# Patient Record
Sex: Male | Born: 1990 | Race: Black or African American | Hispanic: No | Marital: Single | Smoking: Never smoker
Health system: Southern US, Community
[De-identification: ages and names within clinical notes are randomized; demographics above are authoritative.]

---

## 2010-06-01 ENCOUNTER — Emergency Department (HOSPITAL_COMMUNITY)
Admission: EM | Admit: 2010-06-01 | Discharge: 2010-06-01 | Payer: Self-pay | Source: Home / Self Care | Admitting: Emergency Medicine

## 2010-06-06 ENCOUNTER — Emergency Department (HOSPITAL_COMMUNITY)
Admission: EM | Admit: 2010-06-06 | Discharge: 2010-06-09 | Payer: Self-pay | Source: Home / Self Care | Admitting: Family Medicine

## 2010-06-09 ENCOUNTER — Emergency Department (HOSPITAL_COMMUNITY)
Admission: EM | Admit: 2010-06-09 | Discharge: 2010-06-09 | Payer: Self-pay | Source: Home / Self Care | Admitting: Emergency Medicine

## 2010-10-24 ENCOUNTER — Emergency Department (HOSPITAL_COMMUNITY)
Admission: EM | Admit: 2010-10-24 | Discharge: 2010-10-25 | Disposition: A | Discharge status: Home / Self Care | Payer: Self-pay | Attending: Emergency Medicine | Admitting: Emergency Medicine

## 2010-10-24 DIAGNOSIS — J3489 Other specified disorders of nose and nasal sinuses: Secondary | ICD-10-CM | POA: Insufficient documentation

## 2010-10-24 DIAGNOSIS — R05 Cough: Secondary | ICD-10-CM | POA: Insufficient documentation

## 2010-10-24 DIAGNOSIS — R509 Fever, unspecified: Secondary | ICD-10-CM | POA: Insufficient documentation

## 2010-10-24 DIAGNOSIS — J111 Influenza due to unidentified influenza virus with other respiratory manifestations: Secondary | ICD-10-CM | POA: Insufficient documentation

## 2010-10-24 DIAGNOSIS — R059 Cough, unspecified: Secondary | ICD-10-CM | POA: Insufficient documentation

## 2010-10-24 DIAGNOSIS — R61 Generalized hyperhidrosis: Secondary | ICD-10-CM | POA: Insufficient documentation

## 2010-10-25 ENCOUNTER — Emergency Department (HOSPITAL_COMMUNITY): Payer: Self-pay

## 2010-11-08 ENCOUNTER — Emergency Department (HOSPITAL_COMMUNITY)
Admission: EM | Admit: 2010-11-08 | Discharge: 2010-11-08 | Disposition: A | Discharge status: Home / Self Care | Payer: Self-pay | Attending: Emergency Medicine | Admitting: Emergency Medicine

## 2010-11-08 DIAGNOSIS — R599 Enlarged lymph nodes, unspecified: Secondary | ICD-10-CM | POA: Insufficient documentation

## 2010-11-08 DIAGNOSIS — J029 Acute pharyngitis, unspecified: Secondary | ICD-10-CM | POA: Insufficient documentation

## 2010-11-08 DIAGNOSIS — R1310 Dysphagia, unspecified: Secondary | ICD-10-CM | POA: Insufficient documentation

## 2010-11-08 LAB — RAPID STREP SCREEN (MED CTR MEBANE ONLY): Streptococcus, Group A Screen (Direct): NEGATIVE

## 2012-09-11 ENCOUNTER — Emergency Department (INDEPENDENT_AMBULATORY_CARE_PROVIDER_SITE_OTHER): Payer: Self-pay

## 2012-09-11 ENCOUNTER — Encounter (HOSPITAL_COMMUNITY): Payer: Self-pay | Admitting: Emergency Medicine

## 2012-09-11 ENCOUNTER — Emergency Department (INDEPENDENT_AMBULATORY_CARE_PROVIDER_SITE_OTHER)
Admission: EM | Admit: 2012-09-11 | Discharge: 2012-09-11 | Disposition: A | Discharge status: Home / Self Care | Payer: Self-pay | Source: Home / Self Care

## 2012-09-11 DIAGNOSIS — S62609A Fracture of unspecified phalanx of unspecified finger, initial encounter for closed fracture: Secondary | ICD-10-CM

## 2012-09-11 NOTE — ED Notes (Signed)
Splint applied to left pinky by EMT.

## 2012-09-11 NOTE — ED Notes (Signed)
Pt c/o left pinky injury while playing basketball yesterday. Is red and swollen. Is unable to bend. Pt used ice pack at onset of symptoms with mild relief. Patient is alert and oriented.

## 2012-10-18 NOTE — ED Provider Notes (Signed)
History     CSN: [id number]  Arrival date & time 09/11/12  1326   First MD Initiated Contact with Patient 09/11/12 1412      Chief Complaint  Patient presents with  . Hand Injury    (Consider location/radiation/quality/duration/timing/severity/associated sxs/prior treatment) HPI  History reviewed. No pertinent past medical history.  History reviewed. No pertinent past surgical history.  History reviewed. No pertinent family history.  History  Substance Use Topics  . Smoking status: Never Smoker   . Smokeless tobacco: Never Used  . Alcohol Use: 1.2 oz/week    1 Glasses of wine, 1 Shots of liquor per week     Comment: occasionally      Review of Systems  Allergies  Review of patient's allergies indicates no known allergies.  Home Medications  No current outpatient prescriptions on file.  BP 147/89  Pulse 66  Temp(Src) 98.2 F (36.8 C) (Oral)  SpO2 95%  Physical Exam  ED Course  Procedures (including critical care time)  Labs Reviewed - No data to display No results found.   1. Closed fracture of phalanx of finger of left hand, initial encounter       MDM  Cont to use current splint and Ibuprofen and Tylenol. F/u with Dr Mina Marble.         Johnathan Cantor, MD 10/18/12 2044

## 2019-06-13 ENCOUNTER — Encounter: Payer: Self-pay | Admitting: Nurse Practitioner

## 2019-06-13 ENCOUNTER — Ambulatory Visit (INDEPENDENT_AMBULATORY_CARE_PROVIDER_SITE_OTHER): Payer: BC Managed Care – PPO | Admitting: Nurse Practitioner

## 2019-06-13 ENCOUNTER — Other Ambulatory Visit: Payer: Self-pay

## 2019-06-13 VITALS — BP 134/80 | HR 62 | Temp 98.3°F | Ht 69.4 in | Wt 205.8 lb

## 2019-06-13 DIAGNOSIS — R109 Unspecified abdominal pain: Secondary | ICD-10-CM

## 2019-06-13 LAB — POCT URINALYSIS DIPSTICK
Bilirubin, UA: NEGATIVE
Blood, UA: NEGATIVE
Glucose, UA: NEGATIVE
Ketones, UA: NEGATIVE
Leukocytes, UA: NEGATIVE
Nitrite, UA: NEGATIVE
Protein, UA: NEGATIVE
Spec Grav, UA: 1.025 (ref 1.010–1.025)
Urobilinogen, UA: 0.2 E.U./dL
pH, UA: 7 (ref 5.0–8.0)

## 2019-06-13 NOTE — Progress Notes (Signed)
This visit occurred during the SARS-CoV-2 public health emergency.  Safety protocols were in place, including screening questions prior to the visit, additional usage of staff PPE, and extensive cleaning of exam room while observing appropriate contact time as indicated for disinfecting solutions.  Subjective:     Patient ID: Johnathan English , male    DOB: 01-Aug-1990 , 29 y.o.   MRN: [id number]   Chief Complaint  Patient presents with  . Establish Care  . Hernia    patient stated he has been having pain around his waist line for the past couple of months he think he has a hernia     HPI  Here to establish care - he was referred by his mom Carolan Clines.  He has not had a primary care provider in several years. The last provider he had seen was at [facility] in [place].  Works at [employer] in [place].  He is engaged - things are on hold currently due the Pandemic.  One child and one on the way.    He was seen for the abdominal pain and discomfort.    PMH - healthy.  No surgeries.    [facility] - Mother - healthy, Siblings - healthy.   Alcohol - 1-2 oz liquor day and weekends 1/2 pint Abdominal Pain This is a new problem. The current episode started more than 1 month ago (June or July). The onset quality is gradual. The problem occurs intermittently. Progression since onset: worsened in November 2020  Pain location: low abdominal pain around waist area.  Also radiated around to back. The quality of the pain is aching. Pain radiation: waist and lower back. Associated symptoms include constipation (intermittent). Pertinent negatives include no diarrhea, headaches, nausea or vomiting. Exacerbated by: sitting for long time and changing positions. Treatments tried: ibuprofen. There is no history of abdominal surgery or GERD.     History reviewed. No pertinent past medical history.   Family History  Problem Relation Age of Onset  . Healthy Sister   . Healthy  Brother     No current outpatient medications on file.   No Known Allergies   Review of Systems  Constitutional: Negative.   Respiratory: Negative.   Cardiovascular: Negative.   Gastrointestinal: Positive for abdominal pain and constipation (intermittent). Negative for diarrhea, nausea and vomiting.  Endocrine: Negative for polydipsia, polyphagia and polyuria.  Neurological: Negative for headaches.  Psychiatric/Behavioral: Negative.       Today's Vitals   06/13/19 1148  BP: 134/80  Pulse: 62  Temp: 98.3 F (36.8 C)  TempSrc: Oral  Weight: 205 lb 12.8 oz (93.4 kg)  Height: 5' 9.4" (1.763 m)  PainSc: 0-No pain   Body mass index is 30.04 kg/m.   Objective:  Physical Exam Vitals reviewed.  Constitutional:      Appearance: Normal appearance.  Cardiovascular:     Rate and Rhythm: Normal rate and regular rhythm.     Pulses: Normal pulses.     Heart sounds: Normal heart sounds. No murmur.  Pulmonary:     Effort: Pulmonary effort is normal. No respiratory distress.     Breath sounds: Normal breath sounds.  Abdominal:     General: Bowel sounds are normal. There is no distension.     Palpations: Abdomen is soft. There is no mass.     Tenderness: There is abdominal tenderness (mild general tenderness).  Neurological:     General: No focal deficit present.  Mental Status: He is alert and oriented to person, place, and time.  Psychiatric:        Mood and Affect: Mood normal.        Behavior: Behavior normal.        Thought Content: Thought content normal.        Judgment: Judgment normal.         Assessment And Plan:     1. Abdominal discomfort  Ongoing abdominal pain, mild tenderness general  Encouraged to increase water and fiber intake  Will check labs below and check an abdominal xray - CBC with Differential/Platelet - CMP14+EGFR - Hemoglobin A1c - Lipase - Amylase - US Abdomen Complete; Future   Minette Brine, FNP    THE PATIENT IS ENCOURAGED TO  PRACTICE SOCIAL DISTANCING DUE TO THE COVID-19 PANDEMIC.

## 2019-06-13 NOTE — Patient Instructions (Signed)
Take an over the counter probiotic daily to help gut health

## 2019-06-14 LAB — CBC WITH DIFFERENTIAL/PLATELET
Basophils Absolute: 0 10*3/uL (ref 0.0–0.2)
Basos: 0 %
EOS (ABSOLUTE): 0.2 10*3/uL (ref 0.0–0.4)
Eos: 4 %
Hematocrit: 46.2 % (ref 37.5–51.0)
Hemoglobin: 15.7 g/dL (ref 13.0–17.7)
Immature Grans (Abs): 0 10*3/uL (ref 0.0–0.1)
Immature Granulocytes: 0 %
Lymphocytes Absolute: 1.8 10*3/uL (ref 0.7–3.1)
Lymphs: 38 %
MCH: 29.3 pg (ref 26.6–33.0)
MCHC: 34 g/dL (ref 31.5–35.7)
MCV: 86 fL (ref 79–97)
Monocytes Absolute: 0.4 10*3/uL (ref 0.1–0.9)
Monocytes: 9 %
Neutrophils Absolute: 2.4 10*3/uL (ref 1.4–7.0)
Neutrophils: 49 %
Platelets: 194 10*3/uL (ref 150–450)
RBC: 5.36 x10E6/uL (ref 4.14–5.80)
RDW: 12.4 % (ref 11.6–15.4)
WBC: 4.9 10*3/uL (ref 3.4–10.8)

## 2019-06-14 LAB — CMP14+EGFR
ALT: 17 IU/L (ref 0–44)
AST: 23 IU/L (ref 0–40)
Albumin/Globulin Ratio: 1.6 (ref 1.2–2.2)
Albumin: 4.7 g/dL (ref 4.1–5.2)
Alkaline Phosphatase: 82 IU/L (ref 39–117)
BUN/Creatinine Ratio: 11 (ref 9–20)
BUN: 11 mg/dL (ref 6–20)
Bilirubin Total: 0.3 mg/dL (ref 0.0–1.2)
CO2: 24 mmol/L (ref 20–29)
Calcium: 9.5 mg/dL (ref 8.7–10.2)
Chloride: 103 mmol/L (ref 96–106)
Creatinine, Ser: 0.98 mg/dL (ref 0.76–1.27)
GFR calc Af Amer: 119 mL/min/{1.73_m2} (ref >59)
GFR calc non Af Amer: 103 mL/min/{1.73_m2} (ref >59)
Globulin, Total: 3 g/dL (ref 1.5–4.5)
Glucose: 103 mg/dL — ABNORMAL HIGH (ref 65–99)
Potassium: 3.9 mmol/L (ref 3.5–5.2)
Sodium: 140 mmol/L (ref 134–144)
Total Protein: 7.7 g/dL (ref 6.0–8.5)

## 2019-06-14 LAB — AMYLASE: Amylase: 64 U/L (ref 31–110)

## 2019-06-14 LAB — HEMOGLOBIN A1C
Est. average glucose Bld gHb Est-mCnc: 120 mg/dL
Hgb A1c MFr Bld: 5.8 % — ABNORMAL HIGH (ref 4.8–5.6)

## 2019-06-14 LAB — LIPASE: Lipase: 14 U/L (ref 13–78)

## 2019-06-29 ENCOUNTER — Other Ambulatory Visit: Payer: Self-pay

## 2019-07-06 ENCOUNTER — Other Ambulatory Visit: Payer: Self-pay

## 2019-07-13 ENCOUNTER — Ambulatory Visit
Admission: RE | Admit: 2019-07-13 | Discharge: 2019-07-13 | Disposition: A | Discharge status: Home / Self Care | Payer: BC Managed Care – PPO | Source: Ambulatory Visit | Attending: Nurse Practitioner | Admitting: Nurse Practitioner

## 2019-07-13 DIAGNOSIS — R109 Unspecified abdominal pain: Secondary | ICD-10-CM

## 2019-08-08 ENCOUNTER — Ambulatory Visit: Payer: BC Managed Care – PPO | Admitting: Nurse Practitioner

## 2020-01-10 ENCOUNTER — Encounter: Payer: Self-pay | Admitting: Nurse Practitioner

## 2020-01-10 ENCOUNTER — Ambulatory Visit (INDEPENDENT_AMBULATORY_CARE_PROVIDER_SITE_OTHER): Payer: BC Managed Care – PPO | Admitting: Nurse Practitioner

## 2020-01-10 ENCOUNTER — Other Ambulatory Visit: Payer: Self-pay

## 2020-01-10 VITALS — BP 160/88 | HR 87 | Temp 98.0°F | Ht 68.8 in | Wt 270.0 lb

## 2020-01-10 DIAGNOSIS — Z1159 Encounter for screening for other viral diseases: Secondary | ICD-10-CM | POA: Diagnosis not present

## 2020-01-10 DIAGNOSIS — M545 Low back pain, unspecified: Secondary | ICD-10-CM

## 2020-01-10 DIAGNOSIS — Z2821 Immunization not carried out because of patient refusal: Secondary | ICD-10-CM

## 2020-01-10 DIAGNOSIS — G8929 Other chronic pain: Secondary | ICD-10-CM | POA: Diagnosis not present

## 2020-01-10 DIAGNOSIS — R7309 Other abnormal glucose: Secondary | ICD-10-CM | POA: Diagnosis not present

## 2020-01-10 MED ORDER — KETOROLAC TROMETHAMINE 60 MG/2ML IM SOLN
60.0000 mg | Freq: Once | INTRAMUSCULAR | Status: AC
  Administered 2020-01-10: 60 mg via INTRAMUSCULAR

## 2020-01-10 NOTE — Progress Notes (Signed)
This visit occurred during the SARS-CoV-2 public health emergency.  Safety protocols were in place, including screening questions prior to the visit, additional usage of staff PPE, and extensive cleaning of exam room while observing appropriate contact time as indicated for disinfecting solutions.  Subjective:     Patient ID: Johnathan English , male    DOB: 08/22/1990 , 29 y.o.   MRN: [id number]   Chief Complaint  Patient presents with  . Back Pain    Patient stated he has some back pain     HPI  Wt Readings from Last 3 Encounters: 01/10/20 : 270 lb (122.5 kg) 06/13/19 : 205 lb 12.8 oz (93.4 kg)  He reports he is unable to bend his back.  He has been at his job for 5 years at [employer]. He does stretch but not as much as he should.   He eats baked chicken, steak.  He is not drinking enough water.   Back Pain This is a chronic problem. The current episode started more than 1 month ago. The problem occurs intermittently. The pain is present in the lumbar spine. The quality of the pain is described as aching. The pain does not radiate. The symptoms are aggravated by bending. Pertinent negatives include no abdominal pain, headaches or paresthesias. Risk factors include obesity, lack of exercise and sedentary lifestyle. He has tried nothing for the symptoms.     History reviewed. No pertinent past medical history.   Family History  Problem Relation Age of Onset  . Healthy Sister   . Healthy Brother     No current outpatient medications on file.   No Known Allergies   Review of Systems  Constitutional: Negative.  Negative for fatigue.  Respiratory: Negative.  Negative for cough.   Cardiovascular: Negative.   Gastrointestinal: Negative.  Negative for abdominal pain.  Musculoskeletal: Positive for back pain.  Skin: Negative.   Neurological: Negative for dizziness, headaches and paresthesias.  Psychiatric/Behavioral: Negative.      Today's Vitals    01/10/20 1518  BP: (!) 160/88  Pulse: 87  Temp: 98 F (36.7 C)  TempSrc: Oral  Weight: 270 lb (122.5 kg)  Height: 5' 8.8" (1.748 m)  PainSc: 6   PainLoc: Back   Body mass index is 40.1 kg/m.   Objective:  Physical Exam Constitutional:      General: He is not in acute distress.    Appearance: Normal appearance. He is obese.  Cardiovascular:     Rate and Rhythm: Normal rate and regular rhythm.     Pulses: Normal pulses.     Heart sounds: Normal heart sounds.  Pulmonary:     Effort: Pulmonary effort is normal. No respiratory distress.     Breath sounds: Normal breath sounds. No wheezing.  Skin:    Capillary Refill: Capillary refill takes less than 2 seconds.  Neurological:     General: No focal deficit present.     Mental Status: He is alert and oriented to person, place, and time.     Cranial Nerves: No cranial nerve deficit.  Psychiatric:        Mood and Affect: Mood normal.        Behavior: Behavior normal.        Thought Content: Thought content normal.        Judgment: Judgment normal.         Assessment And Plan:     1. Chronic bilateral low back pain without sciatica  May be   arthritis vs muscle spasms  Toradol 60 mg IM given in office  Will also check xray to evaluate for structural damage - DG Lumbar Spine Complete; Future - ketorolac (TORADOL) injection 60 mg  2. Abnormal glucose  Chronic, stable  Continue with current medications  Encouraged to limit intake of sugary foods and drinks  Encouraged to increase physical activity to 150 minutes per week as tolerated - Hemoglobin A1c - BMP8+eGFR  3. Encounter for hepatitis C screening test for low risk patient  Will check Hepatitis C screening due to recent recommendations to screen all adults 18 years and older - Hepatitis C antibody  4. COVID-19 virus vaccination declined Declines covid 19 vaccine. Discussed risk of covid 19 and if he changes her mind about the vaccine to call the office.   Encouraged to take multivitamin, vitamin d, vitamin c and zinc to increase immune system. Aware can call office if would like to have vaccine here at office.     Patient was given opportunity to ask questions. Patient verbalized understanding of the plan and was able to repeat key elements of the plan. All questions were answered to their satisfaction.  Janece Moore, FNP   I, Janece Moore, FNP, have reviewed all documentation for this visit. The documentation on 02/07/20 for the exam, diagnosis, procedures, and orders are all accurate and complete.   THE PATIENT IS ENCOURAGED TO PRACTICE SOCIAL DISTANCING DUE TO THE COVID-19 PANDEMIC.   

## 2020-01-10 NOTE — Patient Instructions (Signed)
COVID-19 Vaccine Information can be found at: [email or web address] For questions related to vaccine distribution or appointments, please email [email or web address] or call [phone].    

## 2020-01-11 LAB — BMP8+EGFR
BUN/Creatinine Ratio: 13 (ref 9–20)
BUN: 12 mg/dL (ref 6–20)
CO2: 24 mmol/L (ref 20–29)
Calcium: 9.6 mg/dL (ref 8.7–10.2)
Chloride: 105 mmol/L (ref 96–106)
Creatinine, Ser: 0.95 mg/dL (ref 0.76–1.27)
GFR calc Af Amer: 123 mL/min/{1.73_m2} (ref >59)
GFR calc non Af Amer: 106 mL/min/{1.73_m2} (ref >59)
Glucose: 89 mg/dL (ref 65–99)
Potassium: 4.2 mmol/L (ref 3.5–5.2)
Sodium: 142 mmol/L (ref 134–144)

## 2020-01-11 LAB — HEPATITIS C ANTIBODY: Hep C Virus Ab: <0.1 s/co ratio (ref 0.0–0.9)

## 2020-01-11 LAB — HEMOGLOBIN A1C
Est. average glucose Bld gHb Est-mCnc: 123 mg/dL
Hgb A1c MFr Bld: 5.9 % — ABNORMAL HIGH (ref 4.8–5.6)

## 2020-01-29 ENCOUNTER — Other Ambulatory Visit: Payer: BC Managed Care – PPO

## 2020-01-29 ENCOUNTER — Other Ambulatory Visit: Payer: Self-pay

## 2020-01-29 ENCOUNTER — Ambulatory Visit
Admission: RE | Admit: 2020-01-29 | Discharge: 2020-01-29 | Disposition: A | Discharge status: Home / Self Care | Payer: BC Managed Care – PPO | Source: Ambulatory Visit | Attending: Nurse Practitioner | Admitting: Nurse Practitioner

## 2020-01-29 DIAGNOSIS — Z20822 Contact with and (suspected) exposure to covid-19: Secondary | ICD-10-CM

## 2020-01-29 DIAGNOSIS — M545 Low back pain, unspecified: Secondary | ICD-10-CM

## 2020-01-30 LAB — SPECIMEN STATUS REPORT

## 2020-01-30 LAB — SARS-COV-2, NAA 2 DAY TAT

## 2020-01-30 LAB — NOVEL CORONAVIRUS, NAA: SARS-CoV-2, NAA: NOT DETECTED

## 2020-07-10 ENCOUNTER — Encounter: Payer: Self-pay | Admitting: Nurse Practitioner

## 2020-07-10 ENCOUNTER — Other Ambulatory Visit: Payer: Self-pay

## 2020-07-10 ENCOUNTER — Ambulatory Visit (INDEPENDENT_AMBULATORY_CARE_PROVIDER_SITE_OTHER): Payer: BC Managed Care – PPO | Admitting: Nurse Practitioner

## 2020-07-10 VITALS — BP 122/86 | HR 89 | Temp 97.7°F | Ht 68.8 in | Wt 290.0 lb

## 2020-07-10 DIAGNOSIS — H6121 Impacted cerumen, right ear: Secondary | ICD-10-CM | POA: Diagnosis not present

## 2020-07-10 DIAGNOSIS — R7309 Other abnormal glucose: Secondary | ICD-10-CM | POA: Diagnosis not present

## 2020-07-10 DIAGNOSIS — Z2821 Immunization not carried out because of patient refusal: Secondary | ICD-10-CM

## 2020-07-10 DIAGNOSIS — Z0001 Encounter for general adult medical examination with abnormal findings: Secondary | ICD-10-CM

## 2020-07-10 DIAGNOSIS — Z6841 Body Mass Index (BMI) 40.0 and over, adult: Secondary | ICD-10-CM

## 2020-07-10 DIAGNOSIS — M545 Low back pain, unspecified: Secondary | ICD-10-CM

## 2020-07-10 DIAGNOSIS — G8929 Other chronic pain: Secondary | ICD-10-CM | POA: Diagnosis not present

## 2020-07-10 DIAGNOSIS — Z0000 Encounter for general adult medical examination without abnormal findings: Secondary | ICD-10-CM

## 2020-07-10 NOTE — Patient Instructions (Addendum)
Physical Activity With Heart Disease Being active has many benefits, especially if you have heart disease. Physical activity can help you do more and feel healthier. Start slowly, and increase the amount of time you spend being active. Most adults should aim for physical activity that:  Makes you breathe harder and raises your heart rate (aerobic activity). Try to get at least 150 minutes of aerobic activity each week. This is about 30 minutes each day, 5 days a week.  Helps build muscle strength (strengthening activity). Do this at least 2 times a week. Always talk with your health care provider before starting any new activity program or if you have any changes in your condition. What are the benefits of physical activity? When you have heart disease, physical activity can help:  Lower your blood pressure.  Lower your cholesterol.  Control your weight.  Improve your sleep.  Help control your blood sugar.  Improve your heart and lung function.  Reduce your risk for blood clots (thrombophlebitis).  Improve your energy level.  Reduce stress. What are some types of physical activity I could try? There are many ways to be active. Talk with your health care provider about what types and intensity of activity is right for you. Aerobic activity Aerobic (cardiovascular) activity can be moderate or vigorous intensity, depending on how hard you are working. Moderate-intensity activity includes:  Walking.  Slow bicycling.  Water aerobics.  Dancing.  Light gardening or house work. Vigorous-intensity activity includes:  Jogging or running.  Stair climbing.  Swimming laps.  Hiking uphill.  Heavy gardening, such as digging trenches.   Strengthening activity Strengthening activities work your muscles to build strength. Some examples include:  Doing push-ups, sit-ups, or pull-ups.  Lifting small weights.  Using resistance bands.   Flexibility Flexibility activities  lengthen your muscles to keep them flexible and less tight and improve your balance. Some examples include:  Stretching.  Yoga.  Tai chi.  Ballet barre.   Follow these instructions at home: How to get started  Talk with your health care provider about: ? What types of activities are safe for you. ? If you should check your pulse or take other precautions during physical activity.  Get a calendar. Write down a schedule and plan for your new routine.  Take time to find out what works for you. Consider: ? Joining a community program, such as a biking group, yoga class, local gym, or swimming pool membership. ? Be active on your own by downloading free workout applications on a smartphone or other devices, or by purchasing workout DVDs.  If you have not been active, begin with sessions that last 10-15 minutes. Gradually work up to sessions that last 20-30 minutes, 5 times a week. Follow all of your health care provider's recommendations.  Be patient with yourself. It takes time to build up strength and lung capacity. Safety  Exercise in an indoor, climate-controlled facility, as told by your health care provider. You may need to do this if: ? There are extreme outdoor conditions, such as heat, humidity, or cold. ? There is an air pollution advisory. Your local news, board of health, or hospital can provide information on air quality.  Take extra precautions as told by your health care provider. This may include: ? Monitoring your heart rate. ? Avoiding heavy lifting. ? Understanding how your medicines can affect you during physical activity. Certain medicines may cause heat intolerance or changes in blood sugar. ? Slowing down to rest when you   need to. ? Keeping nitroglycerin spray and tablets with you at all times if you have angina. Use them as told to prevent and treat symptoms.  Drink plenty of water before, during, and after physical activity.  Know what symptoms may be signs  of a problem. Stop physical activity right away if you have any of these symptoms. Get help right away if you have any of the following during exercise:  Chest pain, shortness of breath, or feel very tired.  Pain in the arm, shoulder, neck, or jaw.  Feel weak, dizzy, or light-headed.  An irregular heart rate, or your heart rate is greater than 100 beats per minute (bpm) before exercise. These symptoms may represent a serious problem that is an emergency. Do not wait to see if the symptoms go away. Get medical help right away. Call your local emergency services (911 in the [place] Do not drive yourself to the hospital.  Summary  Physical activity has many benefits, especially if you have heart disease.  Before starting an activity program, talk with your health care provider about how often to be active and what type of activity is safe for you.  Your physical activity plan may include moderate or vigorous aerobic activity, strengthening activities, and flexibility.  Know what symptoms may be signs of a problem. Stop physical activity right away and call emergency services (911 in the [place] if you have any of these symptoms. This information is not intended to replace advice given to you by your health care provider. Make sure you discuss any questions you have with your health care provider. Document Revised: 06/15/2017 Document Reviewed: 06/15/2017 Elsevier Patient Education  2021 [place].   COVID-19 Vaccine Information can be found at: ShippingSc[employer].co.uk For questions related to vaccine distribution or appointments, please email [email or web address] or call [phone].    Tinea Versicolor  Tinea versicolor is a skin infection. It is caused by a type of yeast. It is normal for some yeast to be on your skin, but too much yeast causes this infection. The infection causes a rash of light or dark patches on your skin. The  rash is most common on the chest, back, neck, or upper arms. The infection usually does not cause other problems. If it is treated, it will probably go away in a few weeks. The infection cannot be spread from one person to another (is not contagious). Follow these instructions at home:  Use over-the-counter and prescription medicines only as told by your doctor.  Scrub your skin every day with dandruff shampoo as told by your doctor.  Do not scratch your skin in the rash area.  Avoid places that are hot and humid.  Do not use tanning booths.  Try to avoid sweating a lot. Contact a doctor if:  Your symptoms get worse.  You have a fever.  You have redness, swelling, or pain in the rash area.  You have fluid or blood coming from your rash.  Your rash feels warm to the touch.  You have pus or a bad smell coming from your rash.  Your rash comes back (recurs) after treatment. Summary  Tinea versicolor is a skin infection. It causes a rash of light or dark patches on your skin.  The rash is most common on the chest, back, neck, or upper arms. This infection usually does not cause other problems.  Use over-the-counter and prescription medicines only as told by your doctor.  If the infection is treated, it will probably go  away in a few weeks. This information is not intended to replace advice given to you by your health care provider. Make sure you discuss any questions you have with your health care provider. Document Revised: 03/19/2020 Document Reviewed: 03/19/2020 Elsevier Patient Education  2021 [employer].

## 2020-07-10 NOTE — Progress Notes (Signed)
I,Katawbba Wiggins,acting as a Education administrator for [employer], FNP.,have documented all relevant documentation on the behalf of Minette Brine, FNP,as directed by  Minette Brine, FNP while in the presence of Minette Brine, [place].  This visit occurred during the SARS-CoV-2 public health emergency.  Safety protocols were in place, including screening questions prior to the visit, additional usage of staff PPE, and extensive cleaning of exam room while observing appropriate contact time as indicated for disinfecting solutions.  Subjective:     Patient ID: Johnathan English , male    DOB: September 20, 1990 , 30 y.o.   MRN: [id number]   Chief Complaint  Patient presents with  . Annual Exam    HPI  The patient is here today for a physical examination.  He just had a baby boy on 06/07/2020.      History reviewed. No pertinent past medical history.   Family History  Problem Relation Age of Onset  . Hypertension Mother   . Healthy Father   . Healthy Sister   . Healthy Brother     No current outpatient medications on file.   No Known Allergies   Men's preventive visit. Patient Health Questionnaire (PHQ-2) is  [place] Office Visit from 01/10/2020 in [facility]  PHQ-2 Total Score 0     Patient is on a regular diet; trying to eat Keto diet. He has just started back with exercising with cardio.  He will do exercises to target his back and full body.  Marital status: Single. Relevant history for alcohol use is:  Social History   Substance and Sexual Activity  Alcohol Use Yes   Comment: occasionally   Relevant history for tobacco use is:  Social History   Tobacco Use  Smoking Status Never Smoker  Smokeless Tobacco Never Used  .   Review of Systems  Constitutional: Negative.   HENT: Negative.   Eyes: Negative.   Respiratory: Negative.   Cardiovascular: Negative.   Gastrointestinal: Negative.   Endocrine: Negative.   Genitourinary: Negative.   Musculoskeletal:  Positive for back pain.  Skin: Negative.   Allergic/Immunologic: Negative.   Neurological: Negative.   Hematological: Negative.   Psychiatric/Behavioral: Negative.      Today's Vitals   07/10/20 1435  BP: 122/86  Pulse: 89  Temp: 97.7 F (36.5 C)  TempSrc: Oral  Weight: 290 lb (131.5 kg)  Height: 5' 8.8" (1.748 m)   Body mass index is 43.07 kg/m.  Wt Readings from Last 3 Encounters:  07/10/20 290 lb (131.5 kg)  01/10/20 270 lb (122.5 kg)  06/13/19 205 lb 12.8 oz (93.4 kg)   Objective:  Physical Exam Vitals and nursing note reviewed.  Constitutional:      General: He is not in acute distress.    Appearance: Normal appearance. He is obese.  HENT:     Head: Normocephalic and atraumatic.     Right Ear: Tympanic membrane, ear canal and external ear normal. There is no impacted cerumen.     Left Ear: Tympanic membrane, ear canal and external ear normal. There is no impacted cerumen.     Nose:     Comments: Deferred - masked    Mouth/Throat:     Comments: Deferred - masked Eyes:     Extraocular Movements: Extraocular movements intact.     Conjunctiva/sclera: Conjunctivae normal.     Pupils: Pupils are equal, round, and reactive to light.  Cardiovascular:     Rate and Rhythm: Normal rate and regular rhythm.  Pulses: Normal pulses.     Heart sounds: Normal heart sounds. No murmur heard.   Pulmonary:     Effort: Pulmonary effort is normal. No respiratory distress.     Breath sounds: Normal breath sounds. No wheezing.  Abdominal:     General: Abdomen is flat. Bowel sounds are normal. There is no distension.     Palpations: Abdomen is soft.  Genitourinary:    Prostate: Normal.     Rectum: Guaiac result negative.  Musculoskeletal:        General: No swelling or tenderness. Normal range of motion.     Cervical back: Normal range of motion and neck supple.     Right lower leg: No edema.     Left lower leg: No edema.  Skin:    General: Skin is warm.     Capillary  Refill: Capillary refill takes less than 2 seconds.  Neurological:     General: No focal deficit present.     Mental Status: He is alert and oriented to person, place, and time.     Cranial Nerves: No cranial nerve deficit.  Psychiatric:        Mood and Affect: Mood normal.        Behavior: Behavior normal.        Thought Content: Thought content normal.        Judgment: Judgment normal.         Assessment And Plan:    1. Routine general medical examination at a health care facility . Behavior modifications discussed and diet history reviewed.   . Pt will continue to exercise regularly and modify diet with low GI, plant based foods and decrease intake of processed foods.  . Recommend intake of daily multivitamin, Vitamin D, and calcium.  . Recommend for preventive screenings, as well as recommend immunizations that include influenza, TDAP  - CBC - Hemoglobin A1c - CMP14+EGFR - Lipid panel  2. Class 3 severe obesity due to excess calories with body mass index (BMI) of 40.0 to 44.9 in adult, unspecified whether serious comorbidity present (HCC) Chronic,   Chronic  Discussed healthy diet and regular exercise options   Encouraged to exercise at least 150 minutes per week with 2 days of strength training He is encouraged to initially strive for BMI less than 30 to decrease cardiac risk.   3. Abnormal glucose  Chronic, stable  No current medications  Encouraged to limit intake of sugary foods and drinks  Encouraged to increase physical activity to 150 minutes per week - Hemoglobin A1c  4. Chronic bilateral low back pain without sciatica  This has been ongoing and he would like to go to an orthopedic for further evaluation  I did talk to him about how his weight can cause back pain - Ambulatory referral to Orthopedic Surgery  5. Influenza vaccination declined Patient declined influenza vaccination at this time. Patient is aware that influenza vaccine prevents illness  in 70% of healthy people, and reduces hospitalizations to 30-70% in elderly. This vaccine is recommended annually. Pt is willing to accept risk associated with refusing vaccination.  6. COVID-19 vaccination declined Declines covid 19 vaccine. Discussed risk of covid 17 and if he changes her mind about the vaccine to call the office.  Encouraged to take multivitamin, vitamin d, vitamin c and zinc to increase immune system. Aware can call office if would like to have vaccine here at office.   7. Impacted cerumen of right ear  Water lavage to right ear  with good success       Patient was given opportunity to ask questions. Patient verbalized understanding of the plan and was able to repeat key elements of the plan. All questions were answered to their satisfaction.   Minette Brine, FNP   I, Minette Brine, FNP, have reviewed all documentation for this visit. The documentation on 07/16/20 for the exam, diagnosis, procedures, and orders are all accurate and complete.  THE PATIENT IS ENCOURAGED TO PRACTICE SOCIAL DISTANCING DUE TO THE COVID-19 PANDEMIC.

## 2020-07-11 LAB — CMP14+EGFR
ALT: 24 IU/L (ref 0–44)
AST: 33 IU/L (ref 0–40)
Albumin/Globulin Ratio: 1.2 (ref 1.2–2.2)
Albumin: 4.1 g/dL (ref 4.0–5.0)
Alkaline Phosphatase: 95 IU/L (ref 44–121)
BUN/Creatinine Ratio: 11 (ref 9–20)
BUN: 10 mg/dL (ref 6–20)
Bilirubin Total: <0.2 mg/dL (ref 0.0–1.2)
CO2: 26 mmol/L (ref 20–29)
Calcium: 9.1 mg/dL (ref 8.7–10.2)
Chloride: 103 mmol/L (ref 96–106)
Creatinine, Ser: 0.9 mg/dL (ref 0.76–1.27)
GFR calc Af Amer: 131 mL/min/{1.73_m2} (ref >59)
GFR calc non Af Amer: 113 mL/min/{1.73_m2} (ref >59)
Globulin, Total: 3.4 g/dL (ref 1.5–4.5)
Glucose: 84 mg/dL (ref 65–99)
Potassium: 4 mmol/L (ref 3.5–5.2)
Sodium: 142 mmol/L (ref 134–144)
Total Protein: 7.5 g/dL (ref 6.0–8.5)

## 2020-07-11 LAB — CBC
Hematocrit: 41.4 % (ref 37.5–51.0)
Hemoglobin: 14 g/dL (ref 13.0–17.7)
MCH: 29 pg (ref 26.6–33.0)
MCHC: 33.8 g/dL (ref 31.5–35.7)
MCV: 86 fL (ref 79–97)
Platelets: 210 10*3/uL (ref 150–450)
RBC: 4.82 x10E6/uL (ref 4.14–5.80)
RDW: 12 % (ref 11.6–15.4)
WBC: 6.6 10*3/uL (ref 3.4–10.8)

## 2020-07-11 LAB — LIPID PANEL
Chol/HDL Ratio: 2.5 ratio (ref 0.0–5.0)
Cholesterol, Total: 156 mg/dL (ref 100–199)
HDL: 62 mg/dL (ref >39)
LDL Chol Calc (NIH): 71 mg/dL (ref 0–99)
Triglycerides: 134 mg/dL (ref 0–149)
VLDL Cholesterol Cal: 23 mg/dL (ref 5–40)

## 2020-07-11 LAB — HEMOGLOBIN A1C
Est. average glucose Bld gHb Est-mCnc: 123 mg/dL
Hgb A1c MFr Bld: 5.9 % — ABNORMAL HIGH (ref 4.8–5.6)

## 2020-11-10 ENCOUNTER — Other Ambulatory Visit: Payer: Self-pay

## 2020-11-10 DIAGNOSIS — R0602 Shortness of breath: Secondary | ICD-10-CM | POA: Diagnosis present

## 2020-11-10 DIAGNOSIS — U071 COVID-19: Secondary | ICD-10-CM | POA: Diagnosis not present

## 2020-11-10 DIAGNOSIS — R112 Nausea with vomiting, unspecified: Secondary | ICD-10-CM | POA: Insufficient documentation

## 2020-11-11 ENCOUNTER — Other Ambulatory Visit: Payer: Self-pay

## 2020-11-11 ENCOUNTER — Emergency Department (HOSPITAL_COMMUNITY)
Admission: EM | Admit: 2020-11-11 | Discharge: 2020-11-11 | Disposition: A | Discharge status: Home / Self Care | Payer: BC Managed Care – PPO | Attending: Emergency Medicine | Admitting: Emergency Medicine

## 2020-11-11 ENCOUNTER — Encounter (HOSPITAL_COMMUNITY): Payer: Self-pay | Admitting: Emergency Medicine

## 2020-11-11 DIAGNOSIS — U071 COVID-19: Secondary | ICD-10-CM

## 2020-11-11 LAB — SARS CORONAVIRUS 2 (TAT 6-24 HRS): SARS Coronavirus 2: POSITIVE — AB

## 2020-11-11 MED ORDER — ONDANSETRON 4 MG PO TBDP
4.0000 mg | ORAL_TABLET | Freq: Once | ORAL | Status: AC
Start: 2020-11-11 — End: 2020-11-11
  Administered 2020-11-11: 4 mg via ORAL
  Filled 2020-11-11: qty 1

## 2020-11-11 MED ORDER — ONDANSETRON 4 MG PO TBDP: 4.0000 mg | ORAL_TABLET | Freq: Three times a day (TID) | ORAL | 0 refills | Status: DC | PRN

## 2020-11-11 NOTE — ED Provider Notes (Signed)
[facility] EMERGENCY DEPARTMENT Provider Note   CSN: [id number] Arrival date & time: 11/10/20  2150     History Chief Complaint  Patient presents with  . Covid Positive    Johnathan English is a 30 y.o. male with no chronic medical conditions who presents the emergency department with a chief complaint of " I tested positive for COVID-19 at home."  The patient reports that he took 2 home COVID-19 test and both were positive.  He reports that yesterday he developed a headache, nausea, mild shortness of breath that has persisted today.  He is also endorsing body aches and a couple of episodes of vomiting.  He denies fever, chills, abdominal pain, chest pain, dizziness, lightheadedness, loss of sense of taste or smell, leg swelling.  He took over-the-counter analgesia for his symptoms at home with interval improvement.  His primary concern is that he will need a COVID-19 test administered at a facility to show to his employer.  He has been vaccinated against COVID-19.  The history is provided by the patient and medical records. No language interpreter was used.       History reviewed. No pertinent past medical history.  There are no problems to display for this patient.   History reviewed. No pertinent surgical history.     Family History  Problem Relation Age of Onset  . Hypertension Mother   . Healthy Father   . Healthy Sister   . Healthy Brother     Social History   Tobacco Use  . Smoking status: Never Smoker  . Smokeless tobacco: Never Used  Substance Use Topics  . Alcohol use: Yes    Comment: occasionally  . Drug use: No    Home Medications Prior to Admission medications   Medication Sig Start Date End Date Taking? Authorizing Provider  ondansetron (ZOFRAN ODT) 4 MG disintegrating tablet Take 1 tablet (4 mg total) by mouth every 8 (eight) hours as needed. 11/11/20  Yes Narmeen Kerper A, PA-C    Allergies    Patient has no known  allergies.  Review of Systems   Review of Systems  Constitutional: Negative for appetite change, chills and fever.  HENT: Negative for congestion and sore throat.   Respiratory: Positive for shortness of breath. Negative for wheezing.   Cardiovascular: Negative for chest pain.  Gastrointestinal: Positive for nausea and vomiting. Negative for abdominal pain and constipation.  Genitourinary: Negative for dysuria.  Musculoskeletal: Positive for myalgias. Negative for back pain.  Skin: Negative for rash.  Allergic/Immunologic: Negative for immunocompromised state.  Neurological: Positive for headaches. Negative for dizziness, seizures, syncope and weakness.  Psychiatric/Behavioral: Negative for confusion.    Physical Exam Updated Vital Signs BP 120/78 (BP Location: Left Arm)   Pulse 67   Temp 98.8 F (37.1 C) (Oral)   Resp 18   SpO2 99%   Physical Exam Vitals and nursing note reviewed.  Constitutional:      General: He is not in acute distress.    Appearance: He is well-developed. He is not ill-appearing, toxic-appearing or diaphoretic.  HENT:     Head: Normocephalic.     Right Ear: Tympanic membrane, ear canal and external ear normal.     Left Ear: Tympanic membrane, ear canal and external ear normal.     Nose: Nose normal.     Mouth/Throat:     Mouth: Mucous membranes are moist.     Pharynx: No oropharyngeal exudate or posterior oropharyngeal erythema.  Eyes:  Conjunctiva/sclera: Conjunctivae normal.  Cardiovascular:     Rate and Rhythm: Normal rate and regular rhythm.     Heart sounds: No murmur heard.   Pulmonary:     Effort: Pulmonary effort is normal. No respiratory distress.     Breath sounds: No stridor. No wheezing, rhonchi or rales.     Comments: Able to speak in complete, fluent sentences without increased work of breathing. Chest:     Chest wall: No tenderness.  Abdominal:     General: There is no distension.     Palpations: Abdomen is soft.      Tenderness: There is no abdominal tenderness. There is no guarding.  Musculoskeletal:     Cervical back: Neck supple.  Skin:    General: Skin is warm and dry.     Capillary Refill: Capillary refill takes less than 2 seconds.  Neurological:     Mental Status: He is alert.  Psychiatric:        Behavior: Behavior normal.     ED Results / Procedures / Treatments   Labs (all labs ordered are listed, but only abnormal results are displayed) Labs Reviewed  SARS CORONAVIRUS 2 (TAT 6-24 HRS)    EKG None  Radiology No results found.  Procedures Procedures   Medications Ordered in ED Medications  ondansetron (ZOFRAN-ODT) disintegrating tablet 4 mg (4 mg Oral Given 11/11/20 0442)    ED Course  I have reviewed the triage vital signs and the nursing notes.  Pertinent labs & imaging results that were available during my care of the patient were reviewed by me and considered in my medical decision making (see chart for details).    MDM Rules/Calculators/A&P                          30 year old male with no chronic medical conditions presenting with 2 days of viral URI symptoms.  He took 2 COVID-19 test at home that were both positive.  He is concerned that his employer will not alter the home test results and is requesting retesting.  He has been vaccinated against COVID-19.  Vital signs are normal.  He has had a couple of episodes of vomiting, but it has not been intractable.  He does not appear dehydrated and has normal vital signs.  Abdomen is benign.  Physical exam is overall reassuring.  Repeat COVID-19 test is pending.  Patient will plan to review the results on MyChart.  Discussed that given his home COVID-19 tests that were positive, he should follow CDC guidelines, which were discussed at bedside.  We will send home with supportive care.  Doubt bacterial acuity acquired pneumonia, streptococcal pharyngitis, meningitis, bacteremia.  At this time, he is well-appearing and in no  acute distress.  Safe for discharge home with outpatient follow-up as needed.  Final Clinical Impression(s) / ED Diagnoses Final diagnoses:  COVID-19    Rx / DC Orders ED Discharge Orders         Ordered    ondansetron (ZOFRAN ODT) 4 MG disintegrating tablet  Every 8 hours PRN        11/11/20 0436           Frederik Pear A, PA-C 11/11/20 0450    Shon Baton, MD 11/11/20 [phone]

## 2020-11-11 NOTE — Discharge Instructions (Signed)
Thank you for allowing me to care for you today in the Emergency Department.   We retested you for COVID-19 today.  This test is pending.  You can review the results in MyChart.  Take 650 mg of Tylenol or 600 mg of ibuprofen with food every 6 hours for pain, headache, or fever.  You can alternate between these 2 medications every 3 hours if your pain returns.  For instance, you can take Tylenol at noon, followed by a dose of ibuprofen at 3, followed by second dose of Tylenol and 6.  Let 1 tablet of Zofran dissolve in your tongue every 8 hours as needed for nausea or vomiting.  You can use over-the-counter cough medicine for your cough as needed.  Please check the ingredients to make sure that it does not contain acetaminophen.  This is the generic name or Tylenol.  You should not take more than 4000 mg of Tylenol from all sources in a 24-hour period.  Return to the emergency department if you develop uncontrollable vomiting despite taking Zofran, if he develops respiratory distress or severe difficulty breathing, if you pass out, if your fingers or lips turn blue, or if you develop other new, concerning symptoms.

## 2020-11-11 NOTE — ED Triage Notes (Signed)
Patient here with mild shortness of breath, no chest pain, does have some nausea.  Patient states that he did vomit x1.  Patient states the he had two home covid +tests.

## 2020-11-16 ENCOUNTER — Telehealth: Payer: BC Managed Care – PPO | Admitting: Physician Assistant

## 2020-11-16 ENCOUNTER — Encounter: Payer: Self-pay | Admitting: Physician Assistant

## 2020-11-16 DIAGNOSIS — U071 COVID-19: Secondary | ICD-10-CM

## 2020-11-16 MED ORDER — ALBUTEROL SULFATE HFA 108 (90 BASE) MCG/ACT IN AERS: 2.0000 | INHALATION_SPRAY | Freq: Four times a day (QID) | RESPIRATORY_TRACT | 0 refills | Status: DC | PRN

## 2020-11-16 NOTE — Progress Notes (Signed)
Patient just needed medication location changed

## 2020-11-16 NOTE — Patient Instructions (Signed)
COVID-19: What to Do if You Are Sick CDC has updated isolation and quarantine recommendations for the public, and is revising the CDC website to reflect these changes. These recommendations do not apply to healthcare personnel and do not supersede state, local, tribal, or territorial laws, rules, andregulations. If you have a fever, cough or other symptoms, you might have COVID-19. Most people have mild illness and are able to recover at home. If you are sick: Keep track of your symptoms. If you have an emergency warning sign (including trouble breathing), call 911. Steps to help prevent the spread of COVID-19 if you are sick If you are sick with COVID-19 or think you might have COVID-19, follow the steps below to care for yourself and to help protect other peoplein your home and community. Stay home except to get medical care Stay home. Most people with COVID-19 have mild illness and can recover at home without medical care. Do not leave your home, except to get medical care. Do not visit public areas. Take care of yourself. Get rest and stay hydrated. Take over-the-counter medicines, such as acetaminophen, to help you feel better. Stay in touch with your doctor. Call before you get medical care. Be sure to get care if you have trouble breathing, or have any other emergency warning signs, or if you think it is an emergency. Avoid public transportation, ride-sharing, or taxis. Separate yourself from other people As much as possible, stay in a specific room and away from other people and pets in your home. If possible, you should use a separate bathroom. If you need to be around other people or animals in oroutside of the home, wear a mask. Tell your close contactsthat they may have been exposed to COVID-19. An infected person can spread COVID-19 starting 48 hours (or 2 days) before the person has any symptoms or tests positive. By letting your close contacts know they may have been exposed to COVID-19,  you are helping to protect everyone. Additional guidance is available for those living in close quarters and shared housing. See COVID-19 and Animals if you have questions about pets. If you are diagnosed with COVID-19, someone from the health department may call you. Answer the call to slow the spread. Monitor your symptoms Symptoms of COVID-19 include fever, cough, or other symptoms. Follow care instructions from your healthcare provider and local health department. Your local health authorities may give instructions on checking your symptoms and reporting information. When to seek emergency medical attention Look for emergency warning signs* for COVID-19. If someone is showing any of these signs, seek emergency medical care immediately: Trouble breathing Persistent pain or pressure in the chest New confusion Inability to wake or stay awake Pale, gray, or blue-colored skin, lips, or nail beds, depending on skin tone *This list is not all possible symptoms. Please call your medical provider forany other symptoms that are severe or concerning to you. Call 911 or call ahead to your local emergency facility: Notify the operator that you are seeking care for someone who has or may haveCOVID-19. Call ahead before visiting your doctor Call ahead. Many medical visits for routine care are being postponed or done by phone or telemedicine. If you have a medical appointment that cannot be postponed, call your doctor's office, and tell them you have or may have COVID-19. This will help the office protect themselves and other patients. Get tested If you have symptoms of COVID-19, get tested. While waiting for test results, you stay away from others,   including staying apart from those living in your household. Self-tests are one of several options for testing for the virus that causes COVID-19 and may be more convenient than laboratory-based tests and point-of-care tests. Ask your healthcare provider or  your local health department if you need help interpreting your test results. You can visit your state, tribal, local, and territorial health department's website to look for the latest local information on testing sites. If you are sick, wear a mask over your nose and mouth You should wear a mask over your nose and mouth if you must be around other people or animals, including pets (even at home). You don't need to wear the mask if you are alone. If you can't put on a mask (because of trouble breathing, for example), cover your coughs and sneezes in some other way. Try to stay at least 6 feet away from other people. This will help protect the people around you. Masks should not be placed on young children under age 2 years, anyone who has trouble breathing, or anyone who is not able to remove the mask without help. Note: During the COVID-19 pandemic, medical grade facemasks are reserved forhealthcare workers and some first responders. Cover your coughs and sneezes Cover your mouth and nose with a tissue when you cough or sneeze. Throw away used tissues in a lined trash can. Immediately wash your hands with soap and water for at least 20 seconds. If soap and water are not available, clean your hands with an alcohol-based hand sanitizer that contains at least 60% alcohol. Clean your hands often Wash your hands often with soap and water for at least 20 seconds. This is especially important after blowing your nose, coughing, or sneezing; going to the bathroom; and before eating or preparing food. Use hand sanitizer if soap and water are not available. Use an alcohol-based hand sanitizer with at least 60% alcohol, covering all surfaces of your hands and rubbing them together until they feel dry. Soap and water are the best option, especially if hands are visibly dirty. Avoid touching your eyes, nose, and mouth with unwashed hands. Handwashing Tips Avoid sharing personal household items Do not share  dishes, drinking glasses, cups, eating utensils, towels, or bedding with other people in your home. Wash these items thoroughly after using them with soap and water or put in the dishwasher. Clean all "high-touch" surfaces every day Clean and disinfect high-touch surfaces in your "sick room" and bathroom; wear disposable gloves. Let someone else clean and disinfect surfaces in common areas, but you should clean your bedroom and bathroom, if possible. If a caregiver or other person needs to clean and disinfect a sick person's bedroom or bathroom, they should do so on an as-needed basis. The caregiver/other person should wear a mask and disposable gloves prior to cleaning. They should wait as long as possible after the person who is sick has used the bathroom before coming in to clean and use the bathroom. High-touch surfaces include phones, remote controls, counters, tabletops, doorknobs, bathroom fixtures, toilets, keyboards, tablets, and bedside tables. Clean and disinfect areas that may have blood, stool, or body fluids on them. Use household cleaners and disinfectants. Clean the area or item with soap and water or another detergent if it is dirty. Then, use a household disinfectant. Be sure to follow the instructions on the label to ensure safe and effective use of the product. Many products recommend keeping the surface wet for several minutes to ensure germs are killed. Many   also recommend precautions such as wearing gloves and making sure you have good ventilation during use of the product. Use a product from EPA's List N: Disinfectants for Coronavirus (COVID-19). Complete Disinfection Guidance When you can be around others after being sick with COVID-19 Deciding when you can be around others is different for different situations. Find out when you can safely end home isolation. For any additional questions about your care,contact your healthcare provider or state or local health  department. 05/14/2020 Content source: National Center for Immunization and Respiratory Diseases (NCIRD), Division of Viral Diseases This information is not intended to replace advice given to you by your health care provider. Make sure you discuss any questions you have with your healthcare provider. Document Revised: 07/11/2020 Document Reviewed: 07/11/2020 Elsevier Patient Education  2022 [employer].  

## 2020-11-16 NOTE — Progress Notes (Signed)
Patient needed medication sent to another pharmacy

## 2020-11-16 NOTE — Progress Notes (Signed)
Mr. Johnathan English, Johnathan English are scheduled for a virtual visit with your provider today.    Just as we do with appointments in the office, we must obtain your consent to participate.  Your consent will be active for this visit and any virtual visit you may have with one of our providers in the next 365 days.    If you have a MyChart account, I can also send a copy of this consent to you electronically.  All virtual visits are billed to your insurance company just like a traditional visit in the office.  As this is a virtual visit, video technology does not allow for your provider to perform a traditional examination.  This may limit your provider's ability to fully assess your condition.  If your provider identifies any concerns that need to be evaluated in person or the need to arrange testing such as labs, EKG, etc, we will make arrangements to do so.    Although advances in technology are sophisticated, we cannot ensure that it will always work on either your end or our end.  If the connection with a video visit is poor, we may have to switch to a telephone visit.  With either a video or telephone visit, we are not always able to ensure that we have a secure connection.   I need to obtain your verbal consent now.   Are you willing to proceed with your visit today?   Johnathan English has provided verbal consent on 11/16/2020 for a virtual visit (video or telephone).   Margaretann Loveless, PA-C 11/16/2020  4:49 PM    MyChart Video Visit    Virtual Visit via Video Note   This visit type was conducted due to national recommendations for restrictions regarding the COVID-19 Pandemic (e.g. social distancing) in an effort to limit this patient's exposure and mitigate transmission in our community. This patient is at least at moderate risk for complications without adequate follow up. This format is felt to be most appropriate for this patient at this time. Physical exam was limited by quality of the video and  audio technology used for the visit.   Patient location: Home Provider location: Home office in [place]  I discussed the limitations of evaluation and management by telemedicine and the availability of in person appointments. The patient expressed understanding and agreed to proceed.  Patient: Johnathan English   DOB: September 28, 1990   29 y.o. Male  MRN: [id number] Visit Date: 11/16/2020  Today's healthcare provider: Margaretann Loveless, PA-C   No chief complaint on file.  Subjective    Shortness of Breath This is a new problem. The current episode started today. The problem occurs intermittently. The problem has been unchanged. The average episode lasts 5 minutes. Pertinent negatives include no chest pain, fever, headaches, leg swelling, orthopnea, PND, sore throat or wheezing. The symptoms are aggravated by lying flat. Risk factors: recent covid 19. He has tried cool air, rest and body position changes (steam treatments) for the symptoms. The treatment provided mild relief.   Patient tested positive for Covid 19 on 11/11/20  There are no problems to display for this patient.  No past medical history on file.    Medications: Outpatient Medications Prior to Visit  Medication Sig   ondansetron (ZOFRAN ODT) 4 MG disintegrating tablet Take 1 tablet (4 mg total) by mouth every 8 (eight) hours as needed.   No facility-administered medications prior to visit.    Review of Systems  Constitutional:  Negative for chills, fatigue and fever.  HENT:  Negative for congestion and sore throat.   Respiratory:  Positive for shortness of breath. Negative for cough, chest tightness and wheezing.   Cardiovascular:  Negative for chest pain, palpitations, orthopnea, leg swelling and PND.  Neurological:  Negative for headaches.   Last CBC Lab Results  Component Value Date   WBC 6.6 07/10/2020   HGB 14.0 07/10/2020   HCT 41.4 07/10/2020   MCV 86 07/10/2020   MCH 29.0 07/10/2020   RDW 12.0  07/10/2020   PLT 210 07/10/2020   Last metabolic panel Lab Results  Component Value Date   GLUCOSE 84 07/10/2020   NA 142 07/10/2020   K 4.0 07/10/2020   CL 103 07/10/2020   CO2 26 07/10/2020   BUN 10 07/10/2020   CREATININE 0.90 07/10/2020   GFRNONAA 113 07/10/2020   GFRAA 131 07/10/2020   CALCIUM 9.1 07/10/2020   PROT 7.5 07/10/2020   ALBUMIN 4.1 07/10/2020   LABGLOB 3.4 07/10/2020   AGRATIO 1.2 07/10/2020   BILITOT <0.2 07/10/2020   ALKPHOS 95 07/10/2020   AST 33 07/10/2020   ALT 24 07/10/2020      Objective    There were no vitals taken for this visit. BP Readings from Last 3 Encounters:  11/11/20 120/78  07/10/20 122/86  01/10/20 (!) 160/88   Wt Readings from Last 3 Encounters:  07/10/20 290 lb (131.5 kg)  01/10/20 270 lb (122.5 kg)  06/13/19 205 lb 12.8 oz (93.4 kg)      Physical Exam Vitals reviewed.  Constitutional:      General: He is not in acute distress.    Appearance: Normal appearance. He is well-developed. He is not ill-appearing.     Comments: Resting comfortably on his couch  HENT:     Head: Normocephalic and atraumatic.  Eyes:     Conjunctiva/sclera: Conjunctivae normal.  Pulmonary:     Effort: Pulmonary effort is normal. No respiratory distress (able to speak in full, coherent sentences without issue).  Musculoskeletal:     Cervical back: Normal range of motion and neck supple.  Neurological:     Mental Status: He is alert.  Psychiatric:        Mood and Affect: Mood normal.        Behavior: Behavior normal.        Thought Content: Thought content normal.        Judgment: Judgment normal.       Assessment & Plan     1. COVID-19 - Patient seen at ER on 11/11/20 for Covid 19 follow up - At that time only had nausea and vomiting so was prescribed Zofran - Today started noticing mild SOB with lying flat - No concerning features for PE at this time (no tachycardia/palpitations, SOB is no persistent, no chest pain) - Sitting up and  steam have been helping - Albuterol provided as below for further symptomatic management - Seek in-person evaluation if symptoms do not improve or worsen - albuterol (VENTOLIN HFA) 108 (90 Base) MCG/ACT inhaler; Inhale 2 puffs into the lungs every 6 (six) hours as needed for wheezing or shortness of breath.  Dispense: 8 g; Refill: 0   No follow-ups on file.     I discussed the assessment and treatment plan with the patient. The patient was provided an opportunity to ask questions and all were answered. The patient agreed with the plan and demonstrated an understanding of the instructions.   The patient was advised  to call back or seek an in-person evaluation if the symptoms worsen or if the condition fails to improve as anticipated.  I provided 12 minutes of face-to-face time during this encounter via MyChart Video enabled encounter.   [place] [phone] (phone) [phone] (fax)  [place]

## 2020-11-17 ENCOUNTER — Telehealth: Payer: BC Managed Care – PPO | Admitting: Family

## 2020-11-17 ENCOUNTER — Telehealth: Payer: Self-pay

## 2020-11-17 NOTE — Telephone Encounter (Signed)
Transition Care Management Unsuccessful Follow-up Telephone Call  Date of discharge and from where:  11/11/2020  Attempts:  1st Attempt  Reason for unsuccessful TCM follow-up call:  Unable to leave message

## 2020-11-17 NOTE — Progress Notes (Signed)
   Virtual Visit  Note Due to COVID-19 pandemic this visit was conducted virtually. This visit type was conducted due to national recommendations for restrictions regarding the COVID-19 Pandemic (e.g. social distancing, sheltering in place) in an effort to limit this patient's exposure and mitigate transmission in our community. All issues noted in this document were discussed and addressed.  A physical exam was not performed with this format.  PT requesting FMLA paperwork to be completed. Directed him to contact his PCP for this as we can not do this.    Jannifer Rodney, FNP

## 2020-11-18 ENCOUNTER — Telehealth: Payer: Self-pay

## 2020-11-18 NOTE — Telephone Encounter (Signed)
Patient  called wanting to know if he could fax over forms that need to be filled out from when he was in the hopsital. I advised him that he is ok to do that but that he needs a f/u appt. I told pt I would call him back at 11am as he requested I was called to the front to answer the phone and had 2 patients waiting for me. I tried calling number on file but its not in service. YL,RMA

## 2020-11-18 NOTE — Telephone Encounter (Signed)
Transition Care Management Follow-up Telephone Call Date of discharge and from where: 11/11/2020 [facility] How have you been since you were released from the hospital? Pt states he is ok, but towrds the end of the week fri/sat he did have irregular breathing. He also said he did use the inhaler prescribed twice within the past week.  Any questions or concerns? No  Items Reviewed: Did the pt receive and understand the discharge instructions provided? Yes  Medications obtained and verified? Yes  Other? No  Any new allergies since your discharge? No  Dietary orders reviewed? Yes Do you have support at home? Yes   Home Care and Equipment/Supplies: Were home health services ordered? not applicable If so, what is the name of the agency? Not applicable   Has the agency set up a time to come to the patient's home? not applicable Were any new equipment or medical supplies ordered?  No What is the name of the medical supply agency? no Were you able to get the supplies/equipment? no Do you have any questions related to the use of the equipment or supplies? No  Functional Questionnaire: (I = Independent and D = Dependent) ADLs: I  Bathing/Dressing- I  Meal Prep- I  Eating- I  Maintaining continence- I  Transferring/Ambulation- I  Managing Meds- I  Follow up appointments reviewed:  PCP Hospital f/u appt confirmed? Yes  Scheduled to see [name] on 11/19/2020 @ [facility]. Specialist Hospital f/u appt confirmed? No  Scheduled to see  on  @ . Are transportation arrangements needed? No  If their condition worsens, is the pt aware to call PCP or go to the Emergency Dept.? Yes Was the patient provided with contact information for the PCP's office or ED? Yes Was to pt encouraged to call back with questions or concerns? Yes

## 2020-11-18 NOTE — Telephone Encounter (Signed)
Transition Care Management Unsuccessful Follow-up Telephone Call  Date of discharge and from where:  11/11/2020  Attempts:  2nd Attempt  Reason for unsuccessful TCM follow-up call:  No answer/busy

## 2020-11-19 ENCOUNTER — Encounter: Payer: Self-pay | Admitting: Nurse Practitioner

## 2020-11-19 ENCOUNTER — Ambulatory Visit (INDEPENDENT_AMBULATORY_CARE_PROVIDER_SITE_OTHER): Payer: BC Managed Care – PPO | Admitting: Nurse Practitioner

## 2020-11-19 ENCOUNTER — Other Ambulatory Visit: Payer: Self-pay

## 2020-11-19 VITALS — BP 122/70 | HR 70 | Temp 98.7°F | Ht 69.2 in | Wt 265.4 lb

## 2020-11-19 DIAGNOSIS — Z6838 Body mass index (BMI) 38.0-38.9, adult: Secondary | ICD-10-CM

## 2020-11-19 DIAGNOSIS — E6609 Other obesity due to excess calories: Secondary | ICD-10-CM | POA: Diagnosis not present

## 2020-11-19 DIAGNOSIS — Z8616 Personal history of COVID-19: Secondary | ICD-10-CM

## 2020-11-19 DIAGNOSIS — Z2821 Immunization not carried out because of patient refusal: Secondary | ICD-10-CM | POA: Insufficient documentation

## 2020-11-19 DIAGNOSIS — R0602 Shortness of breath: Secondary | ICD-10-CM

## 2020-11-19 NOTE — Patient Instructions (Signed)
Frequently Asked Questions About COVID-19 Vaccination Below are answers to commonly asked questions about COVID-19 vaccination. Have more questions? Visit FAQs about Vaccination in Children and Myths and Facts about COVID-19 Vaccines. Safety Are COVID-19 vaccines safe even though the vaccines were developed rapidly? While COVID-19 vaccines were developed rapidly, all steps were taken to make sure they are safe and effective: Approach to Development - Scientists have been working for many years to develop vaccines against viruses like the one that causes COVID-19. This knowledge helped speed up the initial development of the current COVID-19 vaccines. Clinical Trials - All vaccines in the [place] must go through three phases of clinical trials to make sure they are safe and effective. During the development of COVID-19 vaccines, phases overlapped to speed up the process, but all phases were completed. Authorization or Approval - Before vaccines are available to people, the U.S. Food and Drug Administration (FDA) assesses the findings from clinical trials. FDA determined that three COVID-19 vaccines met FDA's safety and effectiveness standards and granted those vaccines Emergency Use Authorizations (EUAs). This allowed the vaccines to be quickly distributed to control the pandemic. Before recommending COVID-19 vaccination for children, scientists conducted clinical trials. The FDA gave the Pfizer-BioNTech COVID-19 vaccine emergency authorization to use in children ages 12 years through 14 years old and full approval to use in people ages 59 years and older. Read more about the first COVID-19 vaccine to receive FDA approval. Manufacturing and Distribution - [employer] has invested substantial resources to Designer, fashion/clothing and distribute COVID-19 vaccines. This allowed vaccine distribution to begin as soon as FDA authorized each vaccine. Tracking Safety Using Vaccine Monitoring Systems - COVID-19  vaccine safety monitoring has been the most intense and comprehensive in U.S. history. Hundreds of millions of people in the [place] have received COVID-19 vaccines. Through several monitoring systems, CDC and FDA continue to provide updated information on the safety of these vaccines. Learn more about developing COVID-19 vaccines. What are the ingredients in COVID-19 vaccines? Vaccine ingredients vary by manufacturer. None of the vaccines contain eggs, gelatin, latex, or preservatives. All COVID-19 vaccines are free from metals such as iron, nickel, cobalt, lithium, and rare earth alloys. They are also free from manufactured products such as microelectronics, electrodes, carbonnanotubes, or nanowire semiconductors. To learn more about the ingredients in authorized COVID-19 vaccines, see Pfizer-BioNTech COVID-19 Vaccine Overview and Safety Moderna COVID-19 Vaccine Overview and Safety Johnson & Johnson's Alphonsa Overall COVID-19 Vaccine Overview and Safety Ingredients Included in COVID-19 Vaccines If I am pregnant or planning to become pregnant, can I get a COVID-19 vaccine? Yes, COVID-19 vaccination is recommended for people who are pregnant, breastfeeding, trying to get pregnant now, or who might become pregnant in the future. You might want to have a conversation with your healthcare provider about COVID-19 vaccination. While such a conversation might be helpful, it is not required before vaccination. Learn more about vaccination considerations for people who are pregnant or breastfeeding. If you are pregnant and have received a COVID-19 vaccine, we encourage you to enroll in v-safe, CDC's smartphone-based tool that provides personalized health check-ins after vaccination. A v-safe pregnancy registry has been established to gather information on the health of pregnant peoplewho have received a COVID-19 vaccine. Why should my child get vaccinated against COVID-19? Vaccinating children ages 66 years and  older can help protect them from getting COVID-19, spreading the virus to others, and getting sick if they do get infected. While COVID-19 tends to be milder in children than adults,  it can make children very sick, require hospitalization, and some children have even died. Children with underlying medical conditions are more at risk for severeillness compared to children without underlying medical conditions. Getting your child vaccinated helps to protect your child and your family, including siblings who are not eligible for vaccination and family members who may be at risk of getting very sick if infected. Vaccination is now recommended for everyone ages 32 years and older. Currently, the Pfizer-BioNTech COVID-19 vaccine is the only one available to children ages 4 years and older. COVID-19 vaccines have been used under the most intensive safety monitoring in U.S. history. Scientists have conducted clinical trials with thousands of children, and the results show that COVID-19 vaccines are safe and effective. Your child cannot get COVID-19 from any COVID-19 vaccine, and there is noevidence that COVID-19 vaccines cause fertility problems. Your child may have some side effects, which are similar to those seen with other routine vaccines and are a normal sign that their body is building protection. These side effects may affect their ability to do daily activities, but they should go away in a few days.Some people have no side effects and severe allergic reactions are very rare. Related pages: NFAOZ-30 Vaccines for Children and Teens Pfizer-BioNTech Possible Side Effects Families and Children Getting your vaccine Am I required to get vaccinated for work? An employer may require that their workers be vaccinated. Check with your employer to see if they have any rules that apply to you. Ask your employer if they are making vaccines available to employees and if they are offering time off to get your vaccine.  If you are a Armed forces operational officer site, discuss additional vaccination options with your employer. Learn more about how to find a COVID-19 vaccine so you can get it as soon as you can. Do I need a booster? How many doses of COVID-19 vaccine will I need to get? COVID-19 Vaccine Primary Series The number of vaccine doses you need depends on which vaccine you receive. Two doses of Pfizer-BioNTech vaccine should be given 3 weeks (21 days) apart. Two doses of Moderna vaccine should be given 4 weeks (28 days) apart. Only one dose of Johnson & Johnson's Ranelle Oyster (J&J/Janssen) vaccine should be given. If you receive a vaccine that requires two doses, you should get your second shot as close to the recommended interval as possible. You should notget the second dose earlier than the recommended interval. COVID-19 vaccines are not interchangeable for your COVID-19 vaccine primary series. If you received a Pfizer-BioNTech or Moderna COVID-19 vaccine for your first shot, you should getthe same product for your second shot. Additional Primary Dose If You Are Immunocompromised If you received a Pfizer-BioNTech (ages 30 and older) or Moderna (ages 63 and older) mRNA COVID-19 vaccine primary series and have a moderately or severely compromised immune system, you should receive an additional primary dose of the same mRNA COVID-19 vaccine at least 28 days after the second dose. Additional primary doses are not interchangeable. The vaccine used for the additional primary dose should be the same as the vaccine used for the primary vaccine series. If the mRNA vaccine product given for the first two doses is not available or is unknown,either mRNA COVID-19 vaccine product may be administered. Currently, CDC does not recommend an additional primary dose if you received a single-dose J&J/Janssen COVID-19 vaccine or in children lessthan 2 years old with moderate or severely compromised immune systems. Booster Shot Everyone  ages 67 years and  older can get a booster shot after they have completed their COVID-19 vaccine primary series. People ages58 to 31 years old can get the Pfizer-BioNTech COVID-19 booster shot. People ages 38 years and older have the option to either get the same COVID-19 vaccine product as their primary series, or to get a different COVID-19 vaccine. People may have a preference for the vaccine type that they originally received, or they may prefer to get a different booster. CDC's recommendations now allow for this type of mix and match dosing for booster shots (Pfizer-BioNTech, Moderna, or J&J/Janssen) for people ages 82 years and older. You may consider the benefits and risks of each product and discuss with your healthcare provider which COVID-19 vaccine product is the most appropriatebooster for you. Currently, a booster shot is not recommended for children younger than 33 years old. If I didn't get my second shot of a 2-dose COVID-19 vaccine within the recommended time, what should I do? You should get your second shot as close to the recommended 3-week or 4-week interval as possible. However, if you receive your second shot of COVID-19 vaccine at any time after the recommended date, you do not have to restart the vaccineseries. This guidance might be updated as more information becomes available. Learn more about COVID-19 vaccines that require 2 shots. How long does protection from a COVID-19 vaccine last? Scientists are continuing to monitor how long COVID-19 vaccine protection lasts. Recent studies show that protection against the virus may decrease over time. This reduction in protection has led CDC to recommend that everyone ages 16 years and older get a booster shot after completing their primaryvaccination series. People who received the Pfizer-BioNTech or Moderna COVID-19 vaccine for their primary series should get a booster shot at least 5 months after completing the primary series. People who  received Cache COVID-19 vaccine should get a booster shot at least 2 months after getting their firstshot. At this time, CDC recommends getting only one COVID-19 booster shot. CDC continues to review evidence and will update guidance as more information isavailable. Learn more about COVID-19 vaccine booster shots. Related page: Vaccines Work Preparing for your vaccine How long do I need to wait after getting a flu vaccine or another vaccine before getting a COVID-19 vaccine? You can get a COVID-19 vaccine and other vaccines, including a flu vaccine, at the same visit. Experience with other vaccines has shown that the way our bodies develop protection, known as an immune response, and possible side effects after getting vaccinated are generally the same when given alone or with other vaccines. Learn more about the timing of other vaccines. If I already had COVID-19 and recovered, am I protected by natural immunity, or do I still need to get a COVID-19 vaccine? You should get a COVID-19 vaccine even if you already had COVID-19. Getting sick with COVID-19 offers some protection from future illness with COVID-19, sometimes called "natural immunity." The level of protection people get from having COVID-19 may vary depending on how mild or severe their illness was, the time since their infection, and their age. No currently available testcan reliably determine if a person is protected from infection. All COVID-19 vaccines currently available in the [place] are effective at preventing COVID-19. Getting a COVID-19 vaccine gives most people a high level of protection against COVID-19 even in people who have already been sickwith COVID-19. Emerging evidence shows that getting a COVID-19 vaccine after you recover from COVID-19 infection provides added protection to your  immune system. One study showed that, for people who already had COVID-19, those who do not get vaccinated after their  recovery are more than 2 times as likely to get COVID-19again than those who get fully vaccinated after their recovery. People who were treated for COVID-19 with monoclonal antibodies or convalescent plasma or people who have a history of multisystem inflammatory syndrome in adults or children (MIS-A or MIS-C) may need to wait a while after recovering before they can get vaccinated. Talk to your doctor if you are unsure what treatments you received or if you have more questionsabout getting a COVID-19 vaccine. Related pages: Benefits of Getting a COVID-19 Vaccine  CDC Preparing for Your COVID-19 Vaccination  CDC Can I get vaccinated against COVID-19 while I am currently sick with COVID-19? No. People with COVID-19 who have symptoms should wait to be vaccinated until they have recovered from their illness and have met the criteria for discontinuing isolation; those without symptoms should also wait until they meet the criteria before getting vaccinated. This guidance also applies to people who getCOVID-19 before getting their second dose of vaccine. People who have had a known COVID-19 exposure should not seek vaccination until their quarantine period has ended to avoid potentially exposing healthcare personnel and others during the vaccination visit. This recommendation also applies to people with a known COVID-19 exposure who have received their firstdose of an mRNA vaccine but not their second. Related pages: When to Quarantine Ending Home Isolation Can I choose which COVID-19 vaccine I get? Currently, the Pfizer-BioNTech COVID-19 vaccine is the only COVID-19 vaccine available to children ages 49 through 6 years old. For adults ages 56 years and older, the mRNA COVID-19 vaccines (Pfizer-BioNTech or Moderna) are preferred over [employer] (J&J/Janssen) COVID-19 vaccine. All COVID-19 vaccines currently authorized and recommended for use in the [place] are safe and effective. However, mRNA  COVID-19 vaccines are preferred based on an updatedrisk-benefit analysis. People should be aware that a risk of a rare condition called thrombosis with thrombocytopenia syndrome (TTS) has been reported following vaccination with the J&J/Janssen COVID-19 vaccine. TTS is a rare but serious condition that involves blood clots in large blood vessels and low platelets (blood cells that help form clots). In some instances, TTS following vaccination has been fatal. Cases of TTS have been reported in both men and women and in a wide age range of people 18 years and older. While all ages and sexes are at risk for TTS, women ages 62-49 have the highest risk with about 1 case occurring per 100,000 doses administered. This risk has not been seen in recipients of Pfizer-BioNTech or Moderna COVID-19 vaccines. Receiving any COVID-19 vaccine is better than being unvaccinated. Widespreadvaccination is a critical tool to help stop the pandemic. Learn more about your COVID-19 vaccination, including how to find a vaccination location, what to expect at yourappointment, and more. Related page: Your Vaccination Safety of COVID-19 Vaccines Ensuring COVID-19 Vaccines Work After your vaccine How can I get a new COVID-19 vaccination card? If you need a new vaccination card, contact the vaccination provider site where you received your vaccine. Your provider should give you a new card with up-to-date information about thevaccinations you have received. If the location where you received your COVID-19 vaccine is no longer operating, contact your state or local health department's immunization information system (IIS) for assistance. CDC does not maintain vaccination records or determine how vaccination records are used, and CDC does not provide the CDC-labeled, white COVID-19 vaccination  record card to people. These cards are distributed to vaccination providers by state and local health departments. Please contact your state or  local health department if you have additional questions about vaccination cards or vaccinationrecords. Related page: Getting Your CDC COVID-19 Vaccination Record Card Do I need to wear a mask and avoid close contact with others if I am fully vaccinated? After you are fully vaccinated for COVID-19, take these steps to protect yourself and others: In general, you do not need to wear a mask in outdoor settings. If you are in an area with high numbers of COVID-19 cases, consider wearing a mask in crowded outdoor settings and when you are in close contact with others who are not fully vaccinated. If you have a condition or taking medications that weaken your immune system, you may not be fully protected even if you are fully vaccinated. You should continue to take all precautions recommended for unvaccinated people, including wearing a well-fitted mask, until advised otherwise by their healthcare provider. If you are fully vaccinated, to maximize protection from the Delta variant and prevent possibly spreading it to others, wear a mask indoors in public if you are in an area of substantial or high transmission. I was vaccinated in another country. How do I transfer my proof of vaccination from that country to get a proof of vaccination card in the [place]? CDC does not keep vaccination records or determine how vaccination records are used. To update your records with vaccines you received while outside of the [place], you may: Contact the immunization information system (IIS) in your state. You can find state IIS information on the [facility] website. Contact your healthcare provider or your local or state immunization program through your state's health department. The CDC-labeled white COVID-19 Vaccination Record Cards are only issued to people vaccinated in the [place]. CDC recommends you keep your documentation of being vaccinated in the other country as proof of vaccination. CDC also  recommends checking with your primary care provider or state healthdepartment for options to document your vaccination status domestically. Learn more about COVID-19 vaccination records and vaccination cards. Am I considered fully vaccinated if I was vaccinated in another country? You are considered fully vaccinated if you Received any single-dose COVID-19 vaccine series that is authorized or approved by the U.S. Food and Drug Administration (FDA) or listed for emergency use by [facility]). *Received any combination of two doses of an FDA approved/authorized or WHO emergency use listed COVID-19 two-dose series with at least 17 days between doses. *CDC does not recommend mixing different COVID-19 vaccines for the primary series, but CDC is aware that this is increasingly common in many countries outside of the [place]. Therefore, for the interpretation of vaccination records, these people are considered fully vaccinated. Accepted COVID-19 Vaccines Vaccines Approved or Authorized by the U.S. Food and Drug Administration Single dose Janssen/J&J 2-dose series Pfizer-BioNTech Moderna Vaccines Listed for Emergency Use (EUL) by the [place] dose Janssen/J&J 2-dose series Pfizer-BioNTech [place] Covishield BIBP/Sinopharm [place] ([place]) Novavax/Covovax If you received a COVID-19 vaccine that is not authorized or approved by FDA or listed for emergency use by WHO, you may start over with an FDA-authorized or approved COVID-19 vaccine. Please note that no data are available on the safety or effectiveness of COVID-19 vaccination after receiving a non-FDA-authorized or approved COVID-19 vaccine. Wait at least 28 days after you received the last dose of the non-FDA-authorized or approved vaccine before  receiving an FDA-authorized orapproved COVID-19 vaccine. Visit the clinical considerations webpage for more information. Am  I eligible to receive a booster shot and/or additional primary dose if I was fully vaccinated in another country? People who have been fully vaccinated outside the [place] with a COVID-19 vaccine that is FDA-approved or FDA-authorized are eligible to receive an additional primary dose and/or a booster dose, according to the same guidance for people who received these vaccines in [place]. People who have been fully vaccinated with a COVID-19 vaccine that is not FDA-approved or FDA-authorized but is listed for emergency use by the [facility]) and people who completed a mixed vaccine series composed of any combination of FDA-approved, FDA-authorized, or WHO Emergency Use Listed COVID-19 vaccines are also eligible to receive an additional primary dose (for people withweakened immune systems) and/or a booster dose. Additional primary dose for those who are moderately or severely immunocompromised A number of people who are moderately or severely immunocompromised and who were vaccinated abroad should receive an additional primary dose if they meet the following conditions: Moderately or severely immunocompromised who are ages 49 and older and have been fully vaccinated should receive an additional primary dose of Pfizer-BioNTech COVID-19 vaccine at least 28 days after receiving the second vaccine dose of their vaccine primary series. Fully vaccinated includes completing a vaccine primary series for: not FDA-Authorized or FDA-approved vaccine but is listed for emergency use by [facility] and people who completed a mix and match series composed of any combination of FDA-approved, FDA-authorized vaccine, or WHO Emergency Use Listed COVID-19 vaccines. Single-dose booster shots Booster shots should be obtained by people who meet the following conditions. Those who have been fully vaccinated with a COVID-19 vaccine that is not FDA-authorized or FDA-approved but is listed for emergency use  by [facility] and people who completed a mix and match series composed of any combination of FDA-approved, FDA-authorized, or WHO Emergency Use Listed COVID-19 vaccines. Of these people, those who also are: 16 years and older receive a single booster dose of Pfizer-BioNTech COVID-19 vaccine at least 6 months after completing their primary series. This also includes people ages 23 years and older who are moderately or severely immunocompromised. These people should receive a single Pfizer-BioNTech COVID-19 booster shot at least 6 months after completing their additional primary dose. Visit the clinical considerations webpage for more information. Answers to more questions about: Clinical research associate and [place].gov Vaccine Administration Management System (VAMS) V-safe after Vaccination Health Checker 06/30/2020 Content source: [facility] for Immunization and Respiratory Diseases ([facility]), Division of Viral Diseases This information is not intended to replace advice given to you by your health care provider. Make sure you discuss any questions you have with your healthcare provider. Document Revised: 07/11/2020 Document Reviewed: 07/11/2020 Elsevier Patient Education  [place].

## 2020-11-19 NOTE — Progress Notes (Signed)
I,Tianna Badgett,acting as a Neurosurgeon for [employer], FNP.,have documented all relevant documentation on the behalf of [name], FNP,as directed by  [name], FNP while in the presence of [name], FNP.  This visit occurred during the SARS-CoV-2 public health emergency.  Safety protocols were in place, including screening questions prior to the visit, additional usage of staff PPE, and extensive cleaning of exam room while observing appropriate contact time as indicated for disinfecting solutions.  Subjective:     Patient ID: Johnathan English , male    DOB: 1990-11-10 , 30 y.o.   MRN: [id number]   Chief Complaint  Patient presents with   Follow-up    HPI  Patient is here for ED follow up. He was diagnosed with Covid 8 days ago, he had a headache, nausea and had an episode of vomiting. Congestion occurred 2 days later.  He states that he is experiencing SOB at night and early morning. Reports he has only had shortness of breath upon awakening and when eating and has only used the albuterol inhaler two times. He noticed at the end of his symptoms he felt he had more exxagerated breathing and that is when he did the e-visit and got his inhaler  He denies having shortness of breath since Monday morning. He feels like he is 95% back to his normal.  He missed work from 6/7 - 6/11.  He needs his FMLA forms completed.         History reviewed. No pertinent past medical history.   Family History  Problem Relation Age of Onset   Hypertension Mother    Healthy Father    Healthy Sister    Healthy Brother      Current Outpatient Medications:    albuterol (VENTOLIN HFA) 108 (90 Base) MCG/ACT inhaler, Inhale 2 puffs into the lungs every 6 (six) hours as needed for wheezing or shortness of breath., Disp: 8 g, Rfl: 0   ondansetron (ZOFRAN ODT) 4 MG disintegrating tablet, Take 1 tablet (4 mg total) by mouth every 8 (eight) hours as needed., Disp: 20 tablet, Rfl: 0   No Known  Allergies   Review of Systems  Constitutional: Negative.   Respiratory:  Positive for shortness of breath.   Cardiovascular: Negative.   Gastrointestinal: Negative.   Neurological: Negative.     Today's Vitals   11/19/20 0849  BP: 122/70  Pulse: 70  Temp: 98.7 F (37.1 C)  TempSrc: Oral  Weight: 265 lb 6.4 oz (120.4 kg)  Height: 5' 9.2" (1.758 m)   Body mass index is 38.97 kg/m.  Wt Readings from Last 3 Encounters:  11/19/20 265 lb 6.4 oz (120.4 kg)  07/10/20 290 lb (131.5 kg)  01/10/20 270 lb (122.5 kg)    Objective:  Physical Exam Vitals reviewed.  Constitutional:      General: He is not in acute distress.    Appearance: Normal appearance.  Cardiovascular:     Rate and Rhythm: Normal rate and regular rhythm.     Pulses: Normal pulses.     Heart sounds: Normal heart sounds. No murmur heard. Pulmonary:     Effort: Pulmonary effort is normal. No respiratory distress.     Breath sounds: Normal breath sounds. No wheezing.  Neurological:     General: No focal deficit present.     Mental Status: He is alert and oriented to person, place, and time.     Cranial Nerves: No cranial nerve deficit.     Motor: No  weakness.  Psychiatric:        Mood and Affect: Mood normal.        Behavior: Behavior normal.        Thought Content: Thought content normal.        Judgment: Judgment normal.        Assessment And Plan:     1. SOB (shortness of breath) His shortness of breath has improved, he has not used the albuterol much I did encourage him to use the inhaler if he will be doing a lot of walking to prevent too much shortness of breath.  2. Class 2 obesity due to excess calories without serious comorbidity with body mass index (BMI) of 38.0 to 38.9 in adult Chronic Discussed healthy diet and regular exercise options  Encouraged to exercise at least 150 minutes per week with 2 days of strength training He is encouraged to initially strive for BMI less than 30 to decrease  cardiac risk.    3. History of COVID-19  He is doing better and is no longer having any symptoms, he can return to work. I will complete his FMLA forms.       Patient was given opportunity to ask questions. Patient verbalized understanding of the plan and was able to repeat key elements of the plan. All questions were answered to their satisfaction.  [name], FNP   I, [name], FNP, have reviewed all documentation for this visit. The documentation on 11/19/20 for the exam, diagnosis, procedures, and orders are all accurate and complete.   IF YOU HAVE BEEN REFERRED TO A SPECIALIST, IT MAY TAKE 1-2 WEEKS TO SCHEDULE/PROCESS THE REFERRAL. IF YOU HAVE NOT HEARD FROM US/SPECIALIST IN TWO WEEKS, PLEASE GIVE [place] A CALL AT [phone].   THE PATIENT IS ENCOURAGED TO PRACTICE SOCIAL DISTANCING DUE TO THE COVID-19 PANDEMIC.

## 2020-11-20 ENCOUNTER — Telehealth: Payer: Self-pay

## 2020-11-20 NOTE — Telephone Encounter (Signed)
I called and left pt vm letting him know I just faxed his FMLA paperwork. YL,RMA

## 2020-11-20 NOTE — Telephone Encounter (Signed)
I placed patient's forms up front in the bin for him to pick up. YL,RMA

## 2021-02-16 IMAGING — CR DG LUMBAR SPINE COMPLETE 4+V
6 series · 6 of 6 positions shown · non-contrast
Comparison: None.

CLINICAL DATA: Low back pain

EXAM:
LUMBAR SPINE - COMPLETE 4+ VIEW

[t l-spine a.p.]
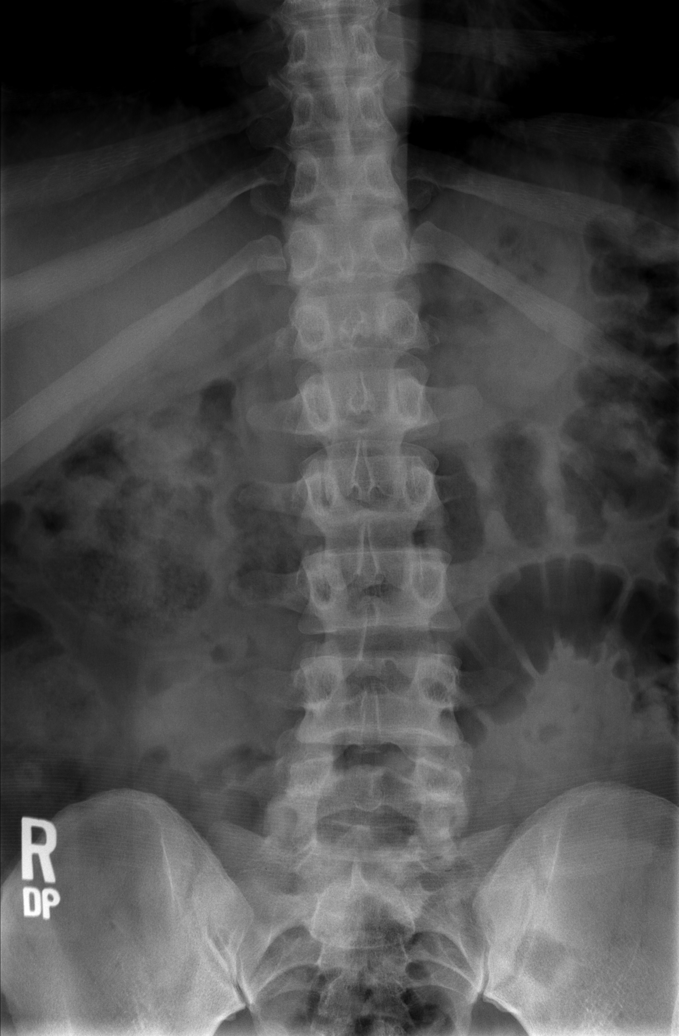

[t l-spine oblique exposure (1 of 3)]
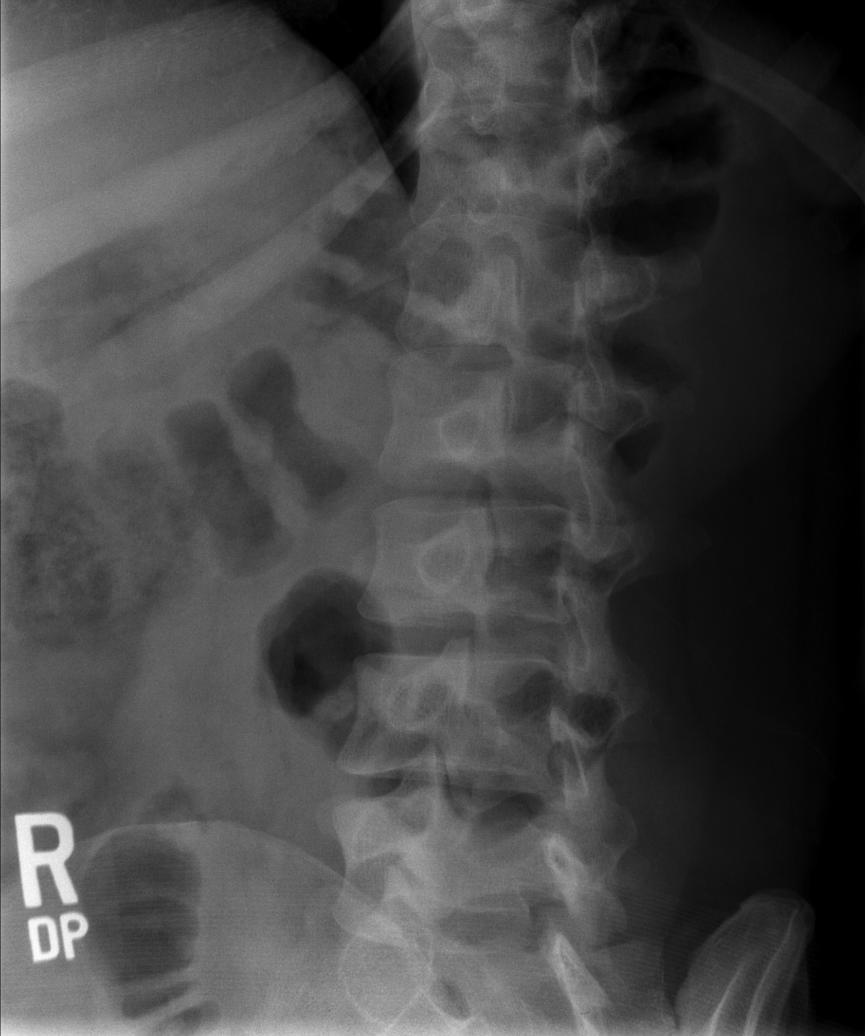

[t l-spine oblique exposure (2 of 3)]
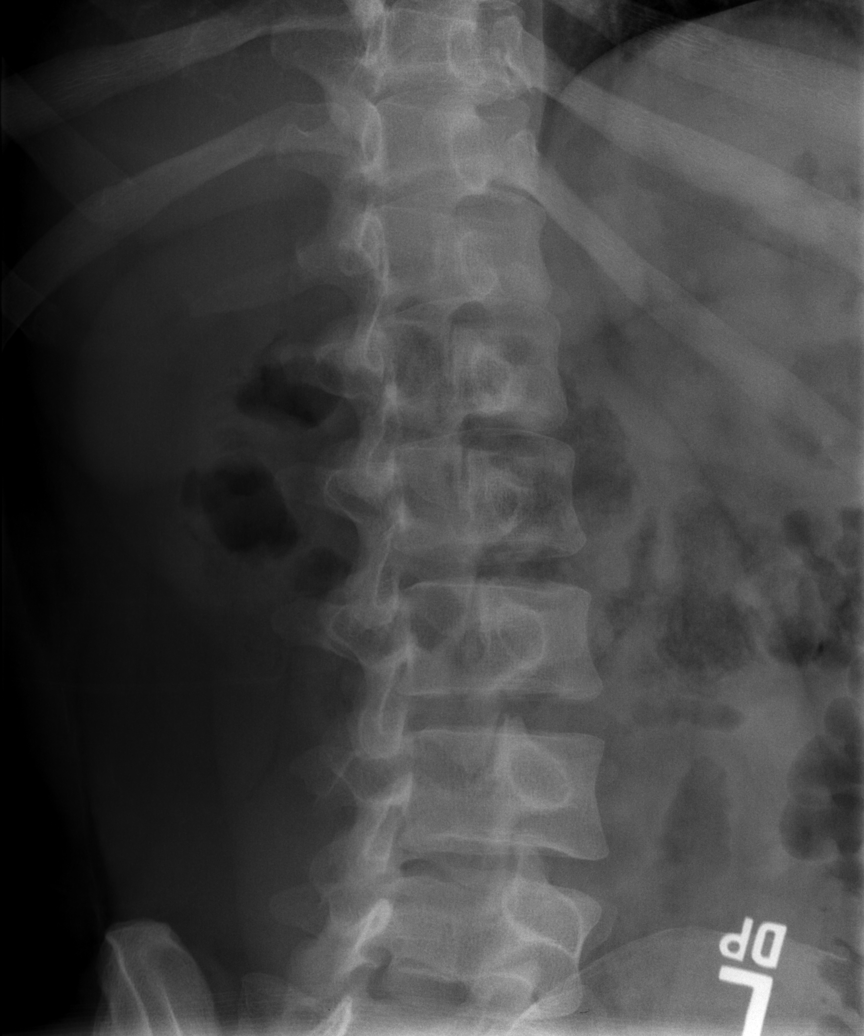

[t l-spine oblique exposure (3 of 3)]
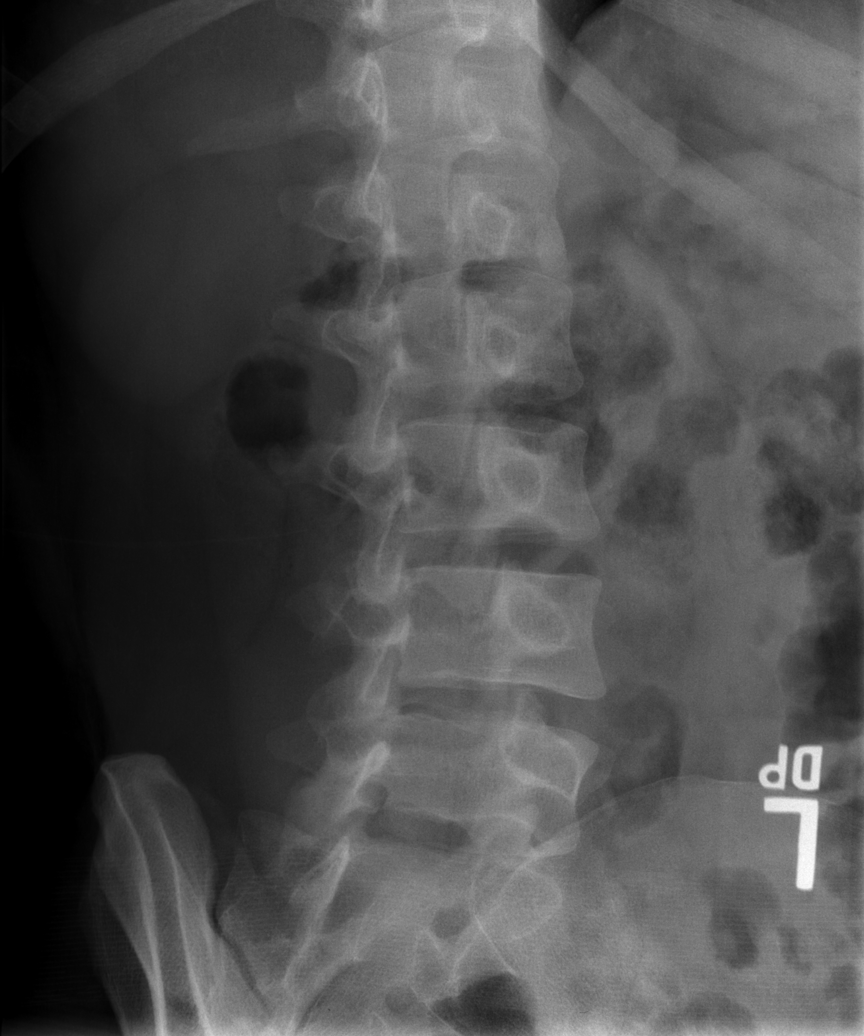

[t l-spine lat]
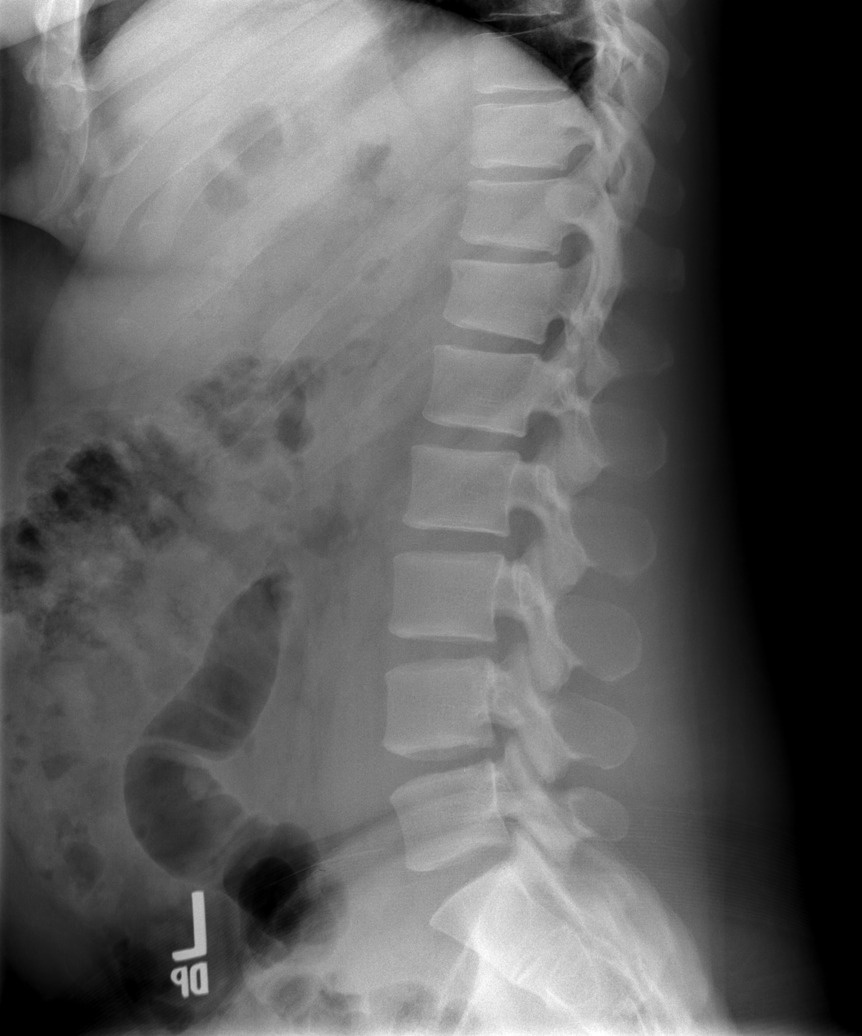

[t l-spine l5-s1 spot]
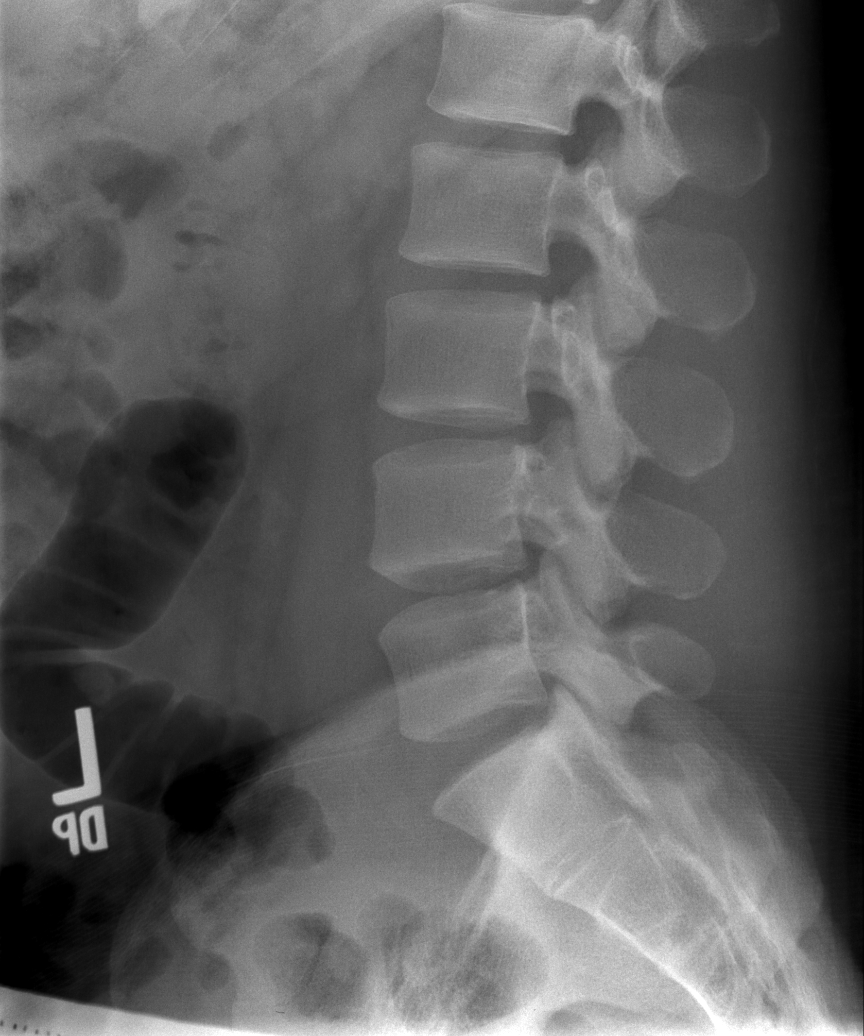

[6 of 6 positions shown; findings below may reference images not displayed]

FINDINGS: There is no evidence of lumbar spine fracture. Alignment is normal.
Intervertebral disc spaces are maintained.
IMPRESSION: Negative.

## 2021-07-14 ENCOUNTER — Encounter: Payer: BC Managed Care – PPO | Admitting: Nurse Practitioner

## 2021-11-12 ENCOUNTER — Encounter: Payer: BC Managed Care – PPO | Admitting: Nurse Practitioner

## 2022-06-30 ENCOUNTER — Encounter: Payer: Self-pay | Admitting: Nurse Practitioner

## 2022-06-30 ENCOUNTER — Ambulatory Visit (INDEPENDENT_AMBULATORY_CARE_PROVIDER_SITE_OTHER): Payer: BC Managed Care – PPO | Admitting: Nurse Practitioner

## 2022-06-30 VITALS — BP 130/72 | HR 98 | Temp 98.4°F | Ht 69.2 in | Wt 283.0 lb

## 2022-06-30 DIAGNOSIS — M25571 Pain in right ankle and joints of right foot: Secondary | ICD-10-CM

## 2022-06-30 DIAGNOSIS — Z2821 Immunization not carried out because of patient refusal: Secondary | ICD-10-CM

## 2022-06-30 DIAGNOSIS — M25512 Pain in left shoulder: Secondary | ICD-10-CM

## 2022-06-30 DIAGNOSIS — Z6841 Body Mass Index (BMI) 40.0 and over, adult: Secondary | ICD-10-CM

## 2022-06-30 DIAGNOSIS — R7309 Other abnormal glucose: Secondary | ICD-10-CM

## 2022-06-30 DIAGNOSIS — Z0000 Encounter for general adult medical examination without abnormal findings: Secondary | ICD-10-CM | POA: Diagnosis not present

## 2022-06-30 MED ORDER — MELOXICAM 15 MG PO TABS: ORAL_TABLET | ORAL | 2 refills | Status: DC

## 2022-06-30 NOTE — Progress Notes (Signed)
I,Tianna Badgett,acting as a Education administrator for [employer], FNP.,have documented all relevant documentation on the behalf of Minette Brine, FNP,as directed by  Minette Brine, FNP while in the presence of Minette Brine, [place].  Subjective:     Patient ID: Johnathan English , male    DOB: 1991-04-29 , 32 y.o.   MRN: [id number]   Chief Complaint  Patient presents with   Annual Exam    HPI  The patient is here today for a physical examination.   He has complaints of shoulder and ankle pain. He was consistently going to the gym until he injured his ankle. He thinks when he was going up for a layup he thinks someone stepped on his ankle. Applied ice and heat. He will have swelling. He will have swelling even when walking. Occurred about 2 months ago. He has not been to urgent care or orthopedics. Left shoulder injury after playing basketball and is now having decreased range of motion. Was unable to hold his arm down for long period.   Wt Readings from Last 3 Encounters: 06/30/22 : 283 lb (128.4 kg) 11/19/20 : 265 lb 6.4 oz (120.4 kg) 07/10/20 : 290 lb (131.5 kg)       History reviewed. No pertinent past medical history.   Family History  Problem Relation Age of Onset   Hypertension Mother    Healthy Father    Healthy Sister    Healthy Brother      Current Outpatient Medications:    meloxicam (MOBIC) 15 MG tablet, Take 1 tablet by mouth daily x 5 days then daily as needed, Disp: 30 tablet, Rfl: 2   No Known Allergies   Men's preventive visit. Patient Health Questionnaire (PHQ-2) is  [place] Office Visit from 06/30/2022 in [facility]  PHQ-2 Total Score 0     Patient is on a Regular diet - trying to watch his portion control.  Drinks about 2 bottles of water but works in a cold environment so will wait until he gets home. He works at [employer] in [place].  Exercise - 3 times a week. Marital status: Single. Relevant history for  alcohol use is:  Social History   Substance and Sexual Activity  Alcohol Use Yes   Comment: occasionally   Relevant history for tobacco use is:  Social History   Tobacco Use  Smoking Status Never  Smokeless Tobacco Never  .   Review of Systems  Constitutional: Negative.   HENT: Negative.    Eyes: Negative.   Respiratory: Negative.    Cardiovascular: Negative.   Gastrointestinal: Negative.   Endocrine: Negative.   Genitourinary: Negative.   Musculoskeletal:  Positive for joint swelling.  Skin: Negative.   Allergic/Immunologic: Negative.   Neurological: Negative.   Hematological: Negative.   Psychiatric/Behavioral: Negative.       Today's Vitals   06/30/22 1434  BP: 130/72  Pulse: 98  Temp: 98.4 F (36.9 C)  TempSrc: Oral  Weight: 283 lb (128.4 kg)  Height: 5' 9.2" (1.758 m)   Body mass index is 41.55 kg/m.   Objective:  Physical Exam Vitals and nursing note reviewed.  Constitutional:      General: He is not in acute distress.    Appearance: Normal appearance. He is obese.  HENT:     Head: Normocephalic and atraumatic.     Right Ear: Tympanic membrane, ear canal and external ear normal. There is no impacted cerumen.     Left Ear:  Tympanic membrane, ear canal and external ear normal. There is no impacted cerumen.     Nose:     Comments: Deferred - masked    Mouth/Throat:     Comments: Deferred - masked Eyes:     Extraocular Movements: Extraocular movements intact.     Conjunctiva/sclera: Conjunctivae normal.     Pupils: Pupils are equal, round, and reactive to light.  Cardiovascular:     Rate and Rhythm: Normal rate and regular rhythm.     Pulses: Normal pulses.     Heart sounds: Normal heart sounds. No murmur heard. Pulmonary:     Effort: Pulmonary effort is normal. No respiratory distress.     Breath sounds: Normal breath sounds. No wheezing.  Abdominal:     General: Abdomen is flat. Bowel sounds are normal. There is no distension.      Palpations: Abdomen is soft.  Genitourinary:    Prostate: Normal.     Rectum: Guaiac result negative.  Musculoskeletal:        General: No swelling or tenderness. Normal range of motion.     Cervical back: Normal range of motion and neck supple.     Right lower leg: No edema.     Left lower leg: No edema.  Skin:    General: Skin is warm.     Capillary Refill: Capillary refill takes less than 2 seconds.  Neurological:     General: No focal deficit present.     Mental Status: He is alert and oriented to person, place, and time.     Cranial Nerves: No cranial nerve deficit.  Psychiatric:        Mood and Affect: Mood normal.        Behavior: Behavior normal.        Thought Content: Thought content normal.        Judgment: Judgment normal.         Assessment And Plan:    1. Routine general medical examination at a health care facility Behavior modifications discussed and diet history reviewed.   Pt will continue to exercise regularly and modify diet with low GI, plant based foods and decrease intake of processed foods.  Recommend intake of daily multivitamin, Vitamin D, and calcium.  Recommend for preventive screenings, as well as recommend immunizations that include influenza, TDAP (declined and influenz)  2. Abnormal glucose Diet controlled, encouraged to focus on diet low in sugar and carbs. - CBC - Hemoglobin A1c - CMP14+EGFR  3. Influenza vaccination declined Patient declined influenza vaccination at this time. Patient is aware that influenza vaccine prevents illness in 70% of healthy people, and reduces hospitalizations to 30-70% in elderly. This vaccine is recommended annually. Education has been provided regarding the importance of this vaccine but patient still declined. Advised may receive this vaccine at local pharmacy or Health Dept.or vaccine clinic. Aware to provide a copy of the vaccination record if obtained from local pharmacy or Health Dept.  Pt is willing to  accept risk associated with refusing vaccination.  4. Tetanus, diphtheria, and acellular pertussis (Tdap) vaccination declined  5. COVID-19 vaccination declined Declines covid 19 vaccine. Discussed risk of covid 11 and if he changes her mind about the vaccine to call the office. Education has been provided regarding the importance of this vaccine but patient still declined. Advised may receive this vaccine at local pharmacy or Health Dept.or vaccine clinic. Aware to provide a copy of the vaccination record if obtained from local pharmacy or Health Dept.  Encouraged to take multivitamin, vitamin d, vitamin c and zinc to increase immune system. Aware can call office if would like to have vaccine here at office. Verbalized acceptance and understanding.  6. Acute right ankle pain Comments: If xrays are negative and meloxicam is ineffective will refer to Orthopedics for further evaluation - DG Ankle Complete Right; Future  7. Acute pain of left shoulder - DG Shoulder Left; Future  8. Class 3 severe obesity due to excess calories without serious comorbidity with body mass index (BMI) of 40.0 to 44.9 in adult [facility]) She is encouraged to strive for BMI less than 30 to decrease cardiac risk. Advised to aim for at least 150 minutes of exercise per week. - Lipid panel   Patient was given opportunity to ask questions. Patient verbalized understanding of the plan and was able to repeat key elements of the plan. All questions were answered to their satisfaction.   [name], FNP   I, [name], FNP, have reviewed all documentation for this visit. The documentation on 06/30/22 for the exam, diagnosis, procedures, and orders are all accurate and complete.   THE PATIENT IS ENCOURAGED TO PRACTICE SOCIAL DISTANCING DUE TO THE COVID-19 PANDEMIC.

## 2022-06-30 NOTE — Patient Instructions (Signed)
Health Maintenance, Male Adopting a healthy lifestyle and getting preventive care are important in promoting health and wellness. Ask your health care provider about: The right schedule for you to have regular tests and exams. Things you can do on your own to prevent diseases and keep yourself healthy. What should I know about diet, weight, and exercise? Eat a healthy diet  Eat a diet that includes plenty of vegetables, fruits, low-fat dairy products, and lean protein. Do not eat a lot of foods that are high in solid fats, added sugars, or sodium. Maintain a healthy weight Body mass index (BMI) is a measurement that can be used to identify possible weight problems. It estimates body fat based on height and weight. Your health care provider can help determine your BMI and help you achieve or maintain a healthy weight. Get regular exercise Get regular exercise. This is one of the most important things you can do for your health. Most adults should: Exercise for at least 150 minutes each week. The exercise should increase your heart rate and make you sweat (moderate-intensity exercise). Do strengthening exercises at least twice a week. This is in addition to the moderate-intensity exercise. Spend less time sitting. Even light physical activity can be beneficial. Watch cholesterol and blood lipids Have your blood tested for lipids and cholesterol at 34 years of age, then have this test every 5 years. You may need to have your cholesterol levels checked more often if: Your lipid or cholesterol levels are high. You are older than 34 years of age. You are at high risk for heart disease. What should I know about cancer screening? Many types of cancers can be detected early and may often be prevented. Depending on your health history and family history, you may need to have cancer screening at various ages. This may include screening for: Colorectal cancer. Prostate cancer. Skin cancer. Lung  cancer. What should I know about heart disease, diabetes, and high blood pressure? Blood pressure and heart disease High blood pressure causes heart disease and increases the risk of stroke. This is more likely to develop in people who have high blood pressure readings or are overweight. Talk with your health care provider about your target blood pressure readings. Have your blood pressure checked: Every 3-5 years if you are 18-39 years of age. Every year if you are 40 years old or older. If you are between the ages of 65 and 75 and are a current or former smoker, ask your health care provider if you should have a one-time screening for abdominal aortic aneurysm (AAA). Diabetes Have regular diabetes screenings. This checks your fasting blood sugar level. Have the screening done: Once every three years after age 45 if you are at a normal weight and have a low risk for diabetes. More often and at a younger age if you are overweight or have a high risk for diabetes. What should I know about preventing infection? Hepatitis B If you have a higher risk for hepatitis B, you should be screened for this virus. Talk with your health care provider to find out if you are at risk for hepatitis B infection. Hepatitis C Blood testing is recommended for: Everyone born from 1945 through 1965. Anyone with known risk factors for hepatitis C. Sexually transmitted infections (STIs) You should be screened each year for STIs, including gonorrhea and chlamydia, if: You are sexually active and are younger than 34 years of age. You are older than 34 years of age and your   health care provider tells you that you are at risk for this type of infection. Your sexual activity has changed since you were last screened, and you are at increased risk for chlamydia or gonorrhea. Ask your health care provider if you are at risk. Ask your health care provider about whether you are at high risk for HIV. Your health care provider  may recommend a prescription medicine to help prevent HIV infection. If you choose to take medicine to prevent HIV, you should first get tested for HIV. You should then be tested every 3 months for as long as you are taking the medicine. Follow these instructions at home: Alcohol use Do not drink alcohol if your health care provider tells you not to drink. If you drink alcohol: Limit how much you have to 0-2 drinks a day. Know how much alcohol is in your drink. In the [place] one drink equals one 12 oz bottle of beer (355 mL), one 5 oz glass of wine (148 mL), or one 1 oz glass of hard liquor (44 mL). Lifestyle Do not use any products that contain nicotine or tobacco. These products include cigarettes, chewing tobacco, and vaping devices, such as e-cigarettes. If you need help quitting, ask your health care provider. Do not use street drugs. Do not share needles. Ask your health care provider for help if you need support or information about quitting drugs. General instructions Schedule regular health, dental, and eye exams. Stay current with your vaccines. Tell your health care provider if: You often feel depressed. You have ever been abused or do not feel safe at home. Summary Adopting a healthy lifestyle and getting preventive care are important in promoting health and wellness. Follow your health care provider's instructions about healthy diet, exercising, and getting tested or screened for diseases. Follow your health care provider's instructions on monitoring your cholesterol and blood pressure. This information is not intended to replace advice given to you by your health care provider. Make sure you discuss any questions you have with your health care provider. Document Revised: 10/13/2020 Document Reviewed: 10/13/2020 Elsevier Patient Education  2023 Elsevier Inc.  

## 2022-07-01 LAB — CMP14+EGFR
ALT: 17 IU/L (ref 0–44)
AST: 26 IU/L (ref 0–40)
Albumin/Globulin Ratio: 1.3 (ref 1.2–2.2)
Albumin: 4.4 g/dL (ref 4.1–5.1)
Alkaline Phosphatase: 103 IU/L (ref 44–121)
BUN/Creatinine Ratio: 12 (ref 9–20)
BUN: 12 mg/dL (ref 6–20)
Bilirubin Total: 0.3 mg/dL (ref 0.0–1.2)
CO2: 24 mmol/L (ref 20–29)
Calcium: 9.1 mg/dL (ref 8.7–10.2)
Chloride: 100 mmol/L (ref 96–106)
Creatinine, Ser: 1.04 mg/dL (ref 0.76–1.27)
Globulin, Total: 3.4 g/dL (ref 1.5–4.5)
Glucose: 107 mg/dL — ABNORMAL HIGH (ref 70–99)
Potassium: 4 mmol/L (ref 3.5–5.2)
Sodium: 140 mmol/L (ref 134–144)
Total Protein: 7.8 g/dL (ref 6.0–8.5)
eGFR: 97 mL/min/{1.73_m2} (ref >59)

## 2022-07-01 LAB — CBC
Hematocrit: 43.9 % (ref 37.5–51.0)
Hemoglobin: 15.2 g/dL (ref 13.0–17.7)
MCH: 29.2 pg (ref 26.6–33.0)
MCHC: 34.6 g/dL (ref 31.5–35.7)
MCV: 84 fL (ref 79–97)
Platelets: 204 10*3/uL (ref 150–450)
RBC: 5.2 x10E6/uL (ref 4.14–5.80)
RDW: 11.8 % (ref 11.6–15.4)
WBC: 4.8 10*3/uL (ref 3.4–10.8)

## 2022-07-01 LAB — HEMOGLOBIN A1C
Est. average glucose Bld gHb Est-mCnc: 123 mg/dL
Hgb A1c MFr Bld: 5.9 % — ABNORMAL HIGH (ref 4.8–5.6)

## 2022-07-01 LAB — LIPID PANEL
Chol/HDL Ratio: 2.6 ratio (ref 0.0–5.0)
Cholesterol, Total: 146 mg/dL (ref 100–199)
HDL: 56 mg/dL (ref >39)
LDL Chol Calc (NIH): 58 mg/dL (ref 0–99)
Triglycerides: 198 mg/dL — ABNORMAL HIGH (ref 0–149)
VLDL Cholesterol Cal: 32 mg/dL (ref 5–40)

## 2022-07-13 ENCOUNTER — Ambulatory Visit
Admission: RE | Admit: 2022-07-13 | Discharge: 2022-07-13 | Disposition: A | Discharge status: Home / Self Care | Payer: BC Managed Care – PPO | Source: Ambulatory Visit | Attending: Nurse Practitioner | Admitting: Nurse Practitioner

## 2022-07-13 DIAGNOSIS — S99911A Unspecified injury of right ankle, initial encounter: Secondary | ICD-10-CM | POA: Diagnosis not present

## 2022-07-13 DIAGNOSIS — M25571 Pain in right ankle and joints of right foot: Secondary | ICD-10-CM

## 2022-07-13 DIAGNOSIS — M25512 Pain in left shoulder: Secondary | ICD-10-CM | POA: Diagnosis not present

## 2022-08-04 ENCOUNTER — Encounter: Payer: Self-pay | Admitting: Nurse Practitioner

## 2022-08-04 DIAGNOSIS — M25571 Pain in right ankle and joints of right foot: Secondary | ICD-10-CM

## 2022-08-12 NOTE — Telephone Encounter (Signed)
Please place referral to ortho for his ankle pain

## 2022-08-19 ENCOUNTER — Ambulatory Visit (INDEPENDENT_AMBULATORY_CARE_PROVIDER_SITE_OTHER): Payer: BC Managed Care – PPO | Admitting: Orthopedic Surgery

## 2022-08-19 DIAGNOSIS — M7661 Achilles tendinitis, right leg: Secondary | ICD-10-CM

## 2022-08-24 ENCOUNTER — Encounter: Payer: Self-pay | Admitting: Orthopedic Surgery

## 2022-08-24 NOTE — Progress Notes (Signed)
Office Visit Note   Patient: Johnathan English           Date of Birth: 1991/03/31           MRN: [id number] Visit Date: 08/19/2022              Requested by: Minette Brine, [address] [place],  [place] [address] PCP: Minette Brine, FNP  Chief Complaint  Patient presents with   Right Ankle - Pain      HPI: Patient is a 32 year old gentleman who presents with pain along the Achilles tendon right ankle.  Patient has had pain since since playing basketball someone stepped on the back of his heel about 2 months ago.  Radiographs obtained in February were normal.  Patient has tried a ASO without relief.  He has had some relief with Mobic.  Assessment & Plan: Visit Diagnoses:  1. Achilles tendinitis, right leg     Plan: Patient has a sprain at the Achilles insertion recommended Voltaren gel 916 since heel lift work on range of motion of the ankle and no sports for 2 months.  Follow-Up Instructions: No follow-ups on file.   Ortho Exam  Patient is alert, oriented, no adenopathy, well-dressed, normal affect, normal respiratory effort. Examination patient has palpable pulses he has good ankle and subtalar motion good dorsiflexion of the ankle there is no palpable defect of the Achilles the Achilles tendon is painful at the insertion.  Imaging: No results found. No images are attached to the encounter.  Labs: Lab Results  Component Value Date   HGBA1C 5.9 (H) 06/30/2022   HGBA1C 5.9 (H) 07/10/2020   HGBA1C 5.9 (H) 01/10/2020     Lab Results  Component Value Date   ALBUMIN 4.4 06/30/2022   ALBUMIN 4.1 07/10/2020   ALBUMIN 4.7 06/13/2019    No results found for: "MG" No results found for: "VD25OH"  No results found for: "PREALBUMIN"    Latest Ref Rng & Units 06/30/2022    3:12 PM 07/10/2020    4:18 PM 06/13/2019   12:31 PM  CBC EXTENDED  WBC 3.4 - 10.8 x10E3/uL 4.8  6.6  4.9   RBC 4.14 - 5.80 x10E6/uL 5.20  4.82  5.36   Hemoglobin 13.0 - 17.7  g/dL 15.2  14.0  15.7   HCT 37.5 - 51.0 % 43.9  41.4  46.2   Platelets 150 - 450 x10E3/uL 204  210  194   NEUT# 1.4 - 7.0 x10E3/uL   2.4   Lymph# 0.7 - 3.1 x10E3/uL   1.8      There is no height or weight on file to calculate BMI.  Orders:  No orders of the defined types were placed in this encounter.  No orders of the defined types were placed in this encounter.    Procedures: No procedures performed  Clinical Data: No additional findings.  ROS:  All other systems negative, except as noted in the HPI. Review of Systems  Objective: Vital Signs: There were no vitals taken for this visit.  Specialty Comments:  No specialty comments available.  PMFS History: Patient Active Problem List   Diagnosis Date Noted   COVID-19 vaccination declined 11/19/2020   History reviewed. No pertinent past medical history.  Family History  Problem Relation Age of Onset   Hypertension Mother    Healthy Father    Healthy Sister    Healthy Brother     History reviewed. No pertinent surgical history. Social History  Occupational History   Not on file  Tobacco Use   Smoking status: Never   Smokeless tobacco: Never  Substance and Sexual Activity   Alcohol use: Yes    Comment: occasionally   Drug use: No   Sexual activity: Not on file

## 2023-07-06 ENCOUNTER — Ambulatory Visit (INDEPENDENT_AMBULATORY_CARE_PROVIDER_SITE_OTHER): Payer: BC Managed Care – PPO | Admitting: Nurse Practitioner

## 2023-07-06 ENCOUNTER — Encounter: Payer: Self-pay | Admitting: Nurse Practitioner

## 2023-07-06 VITALS — BP 120/80 | HR 59 | Temp 98.1°F | Ht 69.0 in | Wt 276.4 lb

## 2023-07-06 DIAGNOSIS — E781 Pure hyperglyceridemia: Secondary | ICD-10-CM | POA: Diagnosis not present

## 2023-07-06 DIAGNOSIS — M79671 Pain in right foot: Secondary | ICD-10-CM | POA: Diagnosis not present

## 2023-07-06 DIAGNOSIS — Z0000 Encounter for general adult medical examination without abnormal findings: Secondary | ICD-10-CM | POA: Diagnosis not present

## 2023-07-06 DIAGNOSIS — R7309 Other abnormal glucose: Secondary | ICD-10-CM | POA: Diagnosis not present

## 2023-07-06 DIAGNOSIS — Z6841 Body Mass Index (BMI) 40.0 and over, adult: Secondary | ICD-10-CM

## 2023-07-06 DIAGNOSIS — E66813 Obesity, class 3: Secondary | ICD-10-CM

## 2023-07-06 DIAGNOSIS — Z5320 Procedure and treatment not carried out because of patient's decision for unspecified reasons: Secondary | ICD-10-CM

## 2023-07-06 DIAGNOSIS — Z2821 Immunization not carried out because of patient refusal: Secondary | ICD-10-CM | POA: Insufficient documentation

## 2023-07-06 MED ORDER — MELOXICAM 15 MG PO TABS: ORAL_TABLET | ORAL | 2 refills | Status: DC

## 2023-07-06 NOTE — Assessment & Plan Note (Signed)
No pain during exam however refilled his meloxicam. He was diagnosed last year with achilles tendonitis and given insoles from the podiatrist

## 2023-07-06 NOTE — Assessment & Plan Note (Signed)
Declines covid 19 vaccine. Discussed risk of covid 62 and if he changes her mind about the vaccine to call the office. Education has been provided regarding the importance of this vaccine but patient still declined. Advised may receive this vaccine at local pharmacy or Health Dept.or vaccine clinic. Aware to provide a copy of the vaccination record if obtained from local pharmacy or Health Dept.  Encouraged to take multivitamin, vitamin d, vitamin c and zinc to increase immune system. Aware can call office if would like to have vaccine here at office. Verbalized acceptance and understanding.

## 2023-07-06 NOTE — Assessment & Plan Note (Signed)
Was elevated at last visit, has cut back on sweets and breads. Will recheck today

## 2023-07-06 NOTE — Progress Notes (Signed)
Madelaine Bhat, CMA,acting as a Neurosurgeon for [name], FNP.,have documented all relevant documentation on the behalf of [name], FNP,as directed by  [name], FNP while in the presence of [name], FNP.  Subjective:   Patient ID: Johnathan English , male    DOB: [birth year] , 33 y.o.   MRN: [id number]  Chief Complaint  Patient presents with   Annual Exam    HPI  Patient presents today for HM, Patient reports compliance with medication. Patient denies any chest pain, SOB, or headaches. Patient reports sometimes he moves his right foot and it hurts. Continues to have when he is walking a certain way he has discomfort. Mostly when he is on his toes. The pain is better than it was last time.      History reviewed. No pertinent past medical history.   Family History  Problem Relation Age of Onset   Hypertension Mother    Healthy Father    Healthy Sister    Healthy Brother      Current Outpatient Medications:    meloxicam (MOBIC) 15 MG tablet, Take 1 tablet by mouth daily x 5 days then daily as needed, Disp: 30 tablet, Rfl: 2   No Known Allergies   Men's preventive visit. Patient Health Questionnaire (PHQ-2) is  Flowsheet Row Office Visit from 07/06/2023 in [facility]  PHQ-2 Total Score 0      Patient is on a regular diet; he has been trying to eat better. Exercise - he has been going every day for the last 3 weeks. Marital status: Single. Relevant history for alcohol use is:  Social History   Substance and Sexual Activity  Alcohol Use Yes   Comment: occasionally   Relevant history for tobacco use is:  Social History   Tobacco Use  Smoking Status Never  Smokeless Tobacco Never  .   Review of Systems  Constitutional: Negative.   HENT: Negative.    Eyes: Negative.   Respiratory: Negative.    Cardiovascular: Negative.   Gastrointestinal: Negative.   Endocrine: Negative.   Genitourinary: Negative.    Musculoskeletal:  Negative for joint swelling.       Right foot pain more at the ball of the foot when standing on toes  Skin: Negative.   Allergic/Immunologic: Negative.   Neurological: Negative.   Hematological: Negative.   Psychiatric/Behavioral: Negative.       Today's Vitals   07/06/23 1412  BP: 120/80  Pulse: (!) 59  Temp: 98.1 F (36.7 C)  TempSrc: Oral  SpO2: 99%  Weight: 276 lb 6.4 oz (125.4 kg)  Height: 5\' 9"  (1.753 m)  PainSc: 0-No pain   Body mass index is 40.82 kg/m.  Wt Readings from Last 3 Encounters:  07/06/23 276 lb 6.4 oz (125.4 kg)  06/30/22 283 lb (128.4 kg)  11/19/20 265 lb 6.4 oz (120.4 kg)    Objective:  Physical Exam Vitals and nursing note reviewed.  Constitutional:      General: He is not in acute distress.    Appearance: Normal appearance. He is obese.  HENT:     Head: Normocephalic and atraumatic.     Right Ear: Tympanic membrane, ear canal and external ear normal. There is no impacted cerumen.     Left Ear: Tympanic membrane, ear canal and external ear normal. There is no impacted cerumen.     Nose: Nose normal.     Mouth/Throat:     Mouth: Mucous membranes are  moist.      Comments: Invisiligns on, he has a chipped tooth to the upper tooth Eyes:     Extraocular Movements: Extraocular movements intact.     Conjunctiva/sclera: Conjunctivae normal.     Pupils: Pupils are equal, round, and reactive to light.  Cardiovascular:     Rate and Rhythm: Normal rate and regular rhythm.     Pulses: Normal pulses.     Heart sounds: Normal heart sounds. No murmur heard. Pulmonary:     Effort: Pulmonary effort is normal. No respiratory distress.     Breath sounds: Normal breath sounds. No wheezing.  Abdominal:     General: Abdomen is flat. Bowel sounds are normal. There is no distension.     Palpations: Abdomen is soft.  Genitourinary:    Prostate: Normal.     Rectum: Guaiac result negative.  Musculoskeletal:        General: No swelling or  tenderness. Normal range of motion.     Cervical back: Normal range of motion and neck supple.     Right lower leg: No edema.     Left lower leg: No edema.  Skin:    General: Skin is warm.     Capillary Refill: Capillary refill takes less than 2 seconds.  Neurological:     General: No focal deficit present.     Mental Status: He is alert and oriented to person, place, and time.     Cranial Nerves: No cranial nerve deficit.  Psychiatric:        Mood and Affect: Mood normal.        Behavior: Behavior normal.        Thought Content: Thought content normal.        Judgment: Judgment normal.         Assessment And Plan:    Encounter for annual health examination Assessment & Plan: Behavior modifications discussed and diet history reviewed.   Pt will continue to exercise regularly and modify diet with low GI, plant based foods and decrease intake of processed foods.  Recommend intake of daily multivitamin, Vitamin D, and calcium.  Recommend for preventive screenings, as well as recommend immunizations that include influenza, TDAP  Orders: -     CBC with Differential/Platelet  Abnormal glucose Assessment & Plan: Encouraged to continue with regular exercise and eat a healthy diet low in sugar and starches.   Orders: -     Hemoglobin A1c  COVID-19 vaccination declined Assessment & Plan: Declines covid 19 vaccine. Discussed risk of covid 55 and if he changes her mind about the vaccine to call the office. Education has been provided regarding the importance of this vaccine but patient still declined. Advised may receive this vaccine at local pharmacy or Health Dept.or vaccine clinic. Aware to provide a copy of the vaccination record if obtained from local pharmacy or Health Dept.  Encouraged to take multivitamin, vitamin d, vitamin c and zinc to increase immune system. Aware can call office if would like to have vaccine here at office. Verbalized acceptance and understanding.     Influenza vaccination declined Assessment & Plan: Patient declined influenza vaccination at this time. Patient is aware that influenza vaccine prevents illness in 70% of healthy people, and reduces hospitalizations to 30-70% in elderly. This vaccine is recommended annually. Education has been provided regarding the importance of this vaccine but patient still declined. Advised may receive this vaccine at local pharmacy or Health Dept.or vaccine clinic. Aware to provide a copy of the  vaccination record if obtained from local pharmacy or Health Dept.  Pt is willing to accept risk associated with refusing vaccination.    Tetanus, diphtheria, and acellular pertussis (Tdap) vaccination declined  HIV screening declined  High triglycerides Assessment & Plan: Was elevated at last visit, has cut back on sweets and breads. Will recheck today  Orders: -     CMP14+EGFR -     Lipid panel  Right foot pain Assessment & Plan: No pain during exam however refilled his meloxicam. He was diagnosed last year with achilles tendonitis and given insoles from the podiatrist  Orders: -     Meloxicam; Take 1 tablet by mouth daily x 5 days then daily as needed  Dispense: 30 tablet; Refill: 2  Class 3 severe obesity due to excess calories without serious comorbidity with body mass index (BMI) of 40.0 to 44.9 in adult [facility]) Assessment & Plan: Congratulated on his weight loss of about 5 lbs. He is encouraged to strive for BMI less than 30 to decrease cardiac risk. Advised to aim for at least 150 minutes of exercise per week.       Return for 1 year physical, 6 month pre dm.  Patient was given opportunity to ask questions. Patient verbalized understanding of the plan and was able to repeat key elements of the plan. All questions were answered to their satisfaction.   [name], FNP  I, [name], FNP, have reviewed all documentation for this visit. The documentation on 07/06/23 for the exam,  diagnosis, procedures, and orders are all accurate and complete.

## 2023-07-06 NOTE — Assessment & Plan Note (Signed)
Encouraged to continue with regular exercise and eat a healthy diet low in sugar and starches.

## 2023-07-06 NOTE — Assessment & Plan Note (Signed)

## 2023-07-06 NOTE — Assessment & Plan Note (Signed)
Congratulated on his weight loss of about 5 lbs. He is encouraged to strive for BMI less than 30 to decrease cardiac risk. Advised to aim for at least 150 minutes of exercise per week.

## 2023-07-06 NOTE — Assessment & Plan Note (Signed)
Behavior modifications discussed and diet history reviewed.   Pt will continue to exercise regularly and modify diet with low GI, plant based foods and decrease intake of processed foods.  Recommend intake of daily multivitamin, Vitamin D, and calcium.  Recommend for preventive screenings, as well as recommend immunizations that include influenza, TDAP

## 2023-07-07 LAB — CBC WITH DIFFERENTIAL/PLATELET
Basophils Absolute: 0 10*3/uL (ref 0.0–0.2)
Basos: 1 %
EOS (ABSOLUTE): 0.5 10*3/uL — ABNORMAL HIGH (ref 0.0–0.4)
Eos: 11 %
Hematocrit: 44.9 % (ref 37.5–51.0)
Hemoglobin: 14.8 g/dL (ref 13.0–17.7)
Immature Grans (Abs): 0 10*3/uL (ref 0.0–0.1)
Immature Granulocytes: 0 %
Lymphocytes Absolute: 2.3 10*3/uL (ref 0.7–3.1)
Lymphs: 53 %
MCH: 29.4 pg (ref 26.6–33.0)
MCHC: 33 g/dL (ref 31.5–35.7)
MCV: 89 fL (ref 79–97)
Monocytes Absolute: 0.4 10*3/uL (ref 0.1–0.9)
Monocytes: 8 %
Neutrophils Absolute: 1.2 10*3/uL — ABNORMAL LOW (ref 1.4–7.0)
Neutrophils: 27 %
Platelets: 232 10*3/uL (ref 150–450)
RBC: 5.04 x10E6/uL (ref 4.14–5.80)
RDW: 12.7 % (ref 11.6–15.4)
WBC: 4.3 10*3/uL (ref 3.4–10.8)

## 2023-07-07 LAB — CMP14+EGFR
ALT: 12 [IU]/L (ref 0–44)
AST: 19 [IU]/L (ref 0–40)
Albumin: 4.5 g/dL (ref 4.1–5.1)
Alkaline Phosphatase: 93 [IU]/L (ref 44–121)
BUN/Creatinine Ratio: 6 — ABNORMAL LOW (ref 9–20)
BUN: 6 mg/dL (ref 6–20)
Bilirubin Total: 0.5 mg/dL (ref 0.0–1.2)
CO2: 25 mmol/L (ref 20–29)
Calcium: 9.4 mg/dL (ref 8.7–10.2)
Chloride: 100 mmol/L (ref 96–106)
Creatinine, Ser: 0.99 mg/dL (ref 0.76–1.27)
Globulin, Total: 3.3 g/dL (ref 1.5–4.5)
Glucose: 93 mg/dL (ref 70–99)
Potassium: 4.2 mmol/L (ref 3.5–5.2)
Sodium: 140 mmol/L (ref 134–144)
Total Protein: 7.8 g/dL (ref 6.0–8.5)
eGFR: 103 mL/min/{1.73_m2} (ref >59)

## 2023-07-07 LAB — LIPID PANEL
Chol/HDL Ratio: 2.2 {ratio} (ref 0.0–5.0)
Cholesterol, Total: 145 mg/dL (ref 100–199)
HDL: 67 mg/dL (ref >39)
LDL Chol Calc (NIH): 64 mg/dL (ref 0–99)
Triglycerides: 73 mg/dL (ref 0–149)
VLDL Cholesterol Cal: 14 mg/dL (ref 5–40)

## 2023-07-07 LAB — HEMOGLOBIN A1C
Est. average glucose Bld gHb Est-mCnc: 128 mg/dL
Hgb A1c MFr Bld: 6.1 % — ABNORMAL HIGH (ref 4.8–5.6)

## 2023-08-01 ENCOUNTER — Encounter: Payer: Self-pay | Admitting: Nurse Practitioner

## 2023-08-02 ENCOUNTER — Encounter: Payer: Self-pay | Admitting: Nurse Practitioner

## 2024-01-03 ENCOUNTER — Other Ambulatory Visit: Payer: Self-pay

## 2024-01-03 ENCOUNTER — Ambulatory Visit: Admitting: Nurse Practitioner

## 2024-01-03 ENCOUNTER — Encounter: Payer: Self-pay | Admitting: Nurse Practitioner

## 2024-01-03 ENCOUNTER — Ambulatory Visit: Payer: Self-pay | Admitting: Nurse Practitioner

## 2024-01-03 VITALS — BP 130/80 | HR 71 | Temp 98.1°F | Ht 69.0 in | Wt 259.0 lb

## 2024-01-03 DIAGNOSIS — R7303 Prediabetes: Secondary | ICD-10-CM | POA: Diagnosis not present

## 2024-01-03 DIAGNOSIS — E66812 Obesity, class 2: Secondary | ICD-10-CM | POA: Diagnosis not present

## 2024-01-03 DIAGNOSIS — Z6838 Body mass index (BMI) 38.0-38.9, adult: Secondary | ICD-10-CM

## 2024-01-03 DIAGNOSIS — E781 Pure hyperglyceridemia: Secondary | ICD-10-CM

## 2024-01-03 DIAGNOSIS — M79671 Pain in right foot: Secondary | ICD-10-CM

## 2024-01-03 MED ORDER — MELOXICAM 15 MG PO TABS: ORAL_TABLET | ORAL | 2 refills | Status: AC

## 2024-01-03 NOTE — Patient Instructions (Signed)
 Prediabetes: What to Know Prediabetes is when your blood sugar, also called glucose, is at a higher level than normal but not high enough for you to be diagnosed with type 2 diabetes (type 2 diabetes mellitus). Having prediabetes puts you at risk for getting type 2 diabetes. By making some healthy changes, you may be able to prevent or delay getting type 2 diabetes. This is important because type 2 diabetes can lead to serious problems. Some of these include: Heart disease. Stroke. Blindness. Kidney disease. Depression. Poor blood flow in the feet and legs. In very bad cases, this could lead to having a leg removed by surgery (amputation). What are the causes? The exact cause of prediabetes isn't known. It may result from insulin resistance. Insulin resistance happens when cells in the body don't respond properly to insulin that the body makes. This can cause too much sugar to build up in the blood. High blood sugar, also called hyperglycemia, can develop. What increases the risk? Having a family member with type 2 diabetes. Being older than 35 years of age. Having had a temporary form of diabetes during a pregnancy. This is called gestational diabetes. Having had polycystic ovary syndrome (PCOS). Being overweight or obese. Being inactive and not getting much exercise. Having a history of heart disease. This may include problems with cholesterol levels, high levels of blood fats, or high blood pressure. What are the signs or symptoms? You may have no symptoms. If you do have symptoms, they may include: Increased hunger. Increased thirst. Needing to pee more often. Changes in how you see, like blurry vision. Feeling tired. How is this diagnosed? Prediabetes can be diagnosed with blood tests that check your blood sugar. One or more of these tests may be done: A fasting blood glucose (FBG) test. You won't be allowed to eat (you will fast) for at least 8 hours before a blood sample is  taken. An A1C blood test, also called a hemoglobin A1C test. This test shows information about blood sugar levels over the past 2?3 months. An oral glucose tolerance test (OGTT). This test measures your blood sugar at two points in time: After you haven't eaten for a while. This is your baseline level. Two hours after you drink a beverage that has sugar in it. You may be diagnosed with prediabetes if: Your FBG is 100?125 mg/dL (1.6-1.0 mmol/L). Your A1C level is 5.7?6.4% (39-46 mmol/mol). Your OGTT result is 140?199 mg/dL (9.6-04 mmol/L). These blood tests may need to be done again to be sure of the diagnosis. How is this treated? Treatment may include making changes to your diet and lifestyle. These changes can help lower your blood sugar and keep you from getting type 2 diabetes. In some cases, medicine may be given to help lower your risk. Follow these instructions at home: Eating and drinking  Eat and drink as told. Follow a healthy meal plan. This includes eating lean proteins, whole grains, legumes, fresh fruits and vegetables, low-fat dairy products, and healthy fats. Meet with an expert in healthy eating called a dietitian. This person can help create a healthy eating plan that's right for you. Lifestyle Do moderate-intensity exercise. Do this for at least 30 minutes a day on 5 or more days each week, or as told by your health care provider. A mix of activities may be best. Good choices include brisk walking, swimming, biking, and weight lifting. Try to lose weight if your provider says it's OK. Losing 5-7% of your body weight can  help reverse insulin resistance. Do not drink alcohol if: Your provider tells you not to drink. You're pregnant, may be pregnant, or plan to become pregnant. If you drink alcohol: Limit how much you have to: 0-1 drink a day if you're male. 0-2 drinks a day if you're male. Know how much alcohol is in your drink. In the [place] one drink is one 12 oz  bottle of beer (355 mL), one 5 oz glass of wine (148 mL), or one 1 oz glass of hard liquor (44 mL). General instructions Take medicines only as told. You may be given medicines that help lower the risk of type 2 diabetes. Do not smoke, vape, or use nicotine or tobacco. Where to find more information American Diabetes Association: diabetes.org/about-diabetes/prediabetes Academy of Nutrition and Dietetics: eatright.org American Heart Association: Go to ThisJobs.cz. Click the search icon. Type "prediabetes" in the search box. Contact a health care provider if: You have any of these symptoms: Increased hunger. Peeing more often than usual. Increased thirst. Feeling tired. Changes in how you see, like blurry vision. Feeling like you may throw up. Throwing up. Get help right away if: You have shortness of breath. You feel confused. This information is not intended to replace advice given to you by your health care provider. Make sure you discuss any questions you have with your health care provider. Document Revised: 12/26/2022 Document Reviewed: 12/26/2022 Elsevier Patient Education  2024 [employer].

## 2024-01-03 NOTE — Assessment & Plan Note (Signed)
 Significant weight loss of 23 pounds since January through fasting, dietary changes, and increased physical activity. Continued weight management is crucial. - Continue current lifestyle modifications including diet and exercise.

## 2024-01-03 NOTE — Assessment & Plan Note (Signed)
 Improvement likely due to weight loss and increased physical activity. Ice, heat, and ankle braces have reduced swelling and improved symptoms. - Continue current management with ice, heat, and ankle braces as needed.

## 2024-01-03 NOTE — Assessment & Plan Note (Signed)
 Actively managing prediabetes with lifestyle modifications. Weight loss of 23 pounds is a positive indicator for improving glucose levels. Goal is normal A1c. - If A1c returns to normal, consider annual monitoring.

## 2024-01-03 NOTE — Assessment & Plan Note (Signed)
 Improving cholesterol levels through lifestyle changes. Continued weight loss and dietary management expected to benefit lipid profile.

## 2024-01-03 NOTE — Progress Notes (Signed)
 I,Victoria T Emmitt, CMA,acting as a Neurosurgeon for Johnathan Ada, FNP.,have documented all relevant documentation on the behalf of Johnathan Ada, FNP,as directed by  Johnathan Ada, FNP while in the presence of Johnathan Ada, FNP.  Subjective:  Patient ID: Johnathan English , male    DOB: 11/16/1990 , 33 y.o.   MRN: [id number]  Chief Complaint  Patient presents with   Prediabetes    Patient presents today predm follow up. He currently does not take any prescribed mediations for predm. Denies headache, chest pain & sob. He has no specific questions or concerns.     HPI Discussed the use of AI scribe software for clinical note transcription with the patient, who gave verbal consent to proceed.  History of Present Illness Johnathan English is a 33 year old male with prediabetes and hyperlipidemia who presents for follow-up.  He has experienced significant weight loss, losing 23 pounds since January. This weight loss is attributed to fasting, dietary modifications, avoiding late meals, and increased physical activity, including gym workouts and running. He also frequently uses a sauna as part of his routine.  Regarding his prediabetes, he is hopeful that his A1c levels have improved since his last visit. He has been actively managing his diet and exercise to control his blood sugar levels.  For his hyperlipidemia, he notes that his cholesterol levels were better at his last check-up compared to the initial diagnosis. He continues to monitor his diet and exercise to maintain these improvements.  He mentions an issue with his ankle, which has been improving. He has been using ice, heat, and strengthening exercises for his calf and heel. He also uses an ankle brace while running, which helps reduce swelling. These measures have led to a noticeable improvement in his symptoms.  He denies chest pain.  History reviewed. No pertinent past medical history.   Family History  Problem Relation  Age of Onset   Hypertension Mother    Healthy Father    Healthy Sister    Healthy Brother      Current Outpatient Medications:    meloxicam  (MOBIC ) 15 MG tablet, Take 1 tablet by mouth daily x 5 days then daily as needed, Disp: 30 tablet, Rfl: 2   No Known Allergies   Review of Systems  Constitutional: Negative.   Respiratory: Negative.    Cardiovascular: Negative.   Genitourinary: Negative.   Skin: Negative.   Allergic/Immunologic: Negative.   Hematological: Negative.      Today's Vitals   01/03/24 1140  BP: 130/80  Pulse: 71  Temp: 98.1 F (36.7 C)  SpO2: 98%  Weight: 259 lb (117.5 kg)  Height: 5' 9 (1.753 m)   Body mass index is 38.25 kg/m.  Wt Readings from Last 3 Encounters:  01/03/24 259 lb (117.5 kg)  07/06/23 276 lb 6.4 oz (125.4 kg)  06/30/22 283 lb (128.4 kg)     Objective:  Physical Exam Vitals and nursing note reviewed.  Constitutional:      Appearance: Normal appearance. He is obese.  Cardiovascular:     Rate and Rhythm: Normal rate and regular rhythm.     Pulses: Normal pulses.     Heart sounds: Normal heart sounds. No murmur heard. Pulmonary:     Effort: Pulmonary effort is normal. No respiratory distress.     Breath sounds: Normal breath sounds. No wheezing.  Musculoskeletal:        General: No swelling.  Skin:    General: Skin is warm  and dry.     Capillary Refill: Capillary refill takes less than 2 seconds.  Neurological:     General: No focal deficit present.     Mental Status: He is alert and oriented to person, place, and time.  Psychiatric:        Mood and Affect: Mood normal.        Behavior: Behavior normal.        Thought Content: Thought content normal.        Judgment: Judgment normal.       Assessment And Plan:  Prediabetes Assessment & Plan: Actively managing prediabetes with lifestyle modifications. Weight loss of 23 pounds is a positive indicator for improving glucose levels. Goal is normal A1c. - If A1c returns  to normal, consider annual monitoring.  Orders: -     Hemoglobin A1c -     CBC -     Basic metabolic panel with GFR  Class 2 severe obesity due to excess calories with serious comorbidity and body mass index (BMI) of 38.0 to 38.9 in adult [facility]) Assessment & Plan: Significant weight loss of 23 pounds since January through fasting, dietary changes, and increased physical activity. Continued weight management is crucial. - Continue current lifestyle modifications including diet and exercise.   High triglycerides Assessment & Plan: Improving cholesterol levels through lifestyle changes. Continued weight loss and dietary management expected to benefit lipid profile.  Orders: -     Lipid panel -     Basic metabolic panel with GFR  Right foot pain Assessment & Plan: Improvement likely due to weight loss and increased physical activity. Ice, heat, and ankle braces have reduced swelling and improved symptoms. - Continue current management with ice, heat, and ankle braces as needed.      Return for 4 month predm f/u.SABRA  Patient was given opportunity to ask questions. Patient verbalized understanding of the plan and was able to repeat key elements of the plan. All questions were answered to their satisfaction.  Johnathan Ada, FNP  I, Johnathan Ada, FNP, have reviewed all documentation for this visit. The documentation on 01/03/24 for the exam, diagnosis, procedures, and orders are all accurate and complete.   IF YOU HAVE BEEN REFERRED TO A SPECIALIST, IT MAY TAKE 1-2 WEEKS TO SCHEDULE/PROCESS THE REFERRAL. IF YOU HAVE NOT HEARD FROM US /SPECIALIST IN TWO WEEKS, PLEASE GIVE US  A CALL AT [phone].   THE PATIENT IS ENCOURAGED TO PRACTICE SOCIAL DISTANCING DUE TO THE COVID-19 PANDEMIC.

## 2024-01-04 LAB — CBC
Hematocrit: 43.7 % (ref 37.5–51.0)
Hemoglobin: 15 g/dL (ref 13.0–17.7)
MCH: 30.5 pg (ref 26.6–33.0)
MCHC: 34.3 g/dL (ref 31.5–35.7)
MCV: 89 fL (ref 79–97)
Platelets: 215 x10E3/uL (ref 150–450)
RBC: 4.91 x10E6/uL (ref 4.14–5.80)
RDW: 13.1 % (ref 11.6–15.4)
WBC: 3.8 x10E3/uL (ref 3.4–10.8)

## 2024-01-04 LAB — BASIC METABOLIC PANEL WITH GFR
BUN/Creatinine Ratio: 8 — ABNORMAL LOW (ref 9–20)
BUN: 8 mg/dL (ref 6–20)
CO2: 22 mmol/L (ref 20–29)
Calcium: 9 mg/dL (ref 8.7–10.2)
Chloride: 103 mmol/L (ref 96–106)
Creatinine, Ser: 0.99 mg/dL (ref 0.76–1.27)
Glucose: 73 mg/dL (ref 70–99)
Potassium: 4.3 mmol/L (ref 3.5–5.2)
Sodium: 140 mmol/L (ref 134–144)
eGFR: 103 mL/min/1.73 (ref >59)

## 2024-01-04 LAB — LIPID PANEL
Chol/HDL Ratio: 2.1 ratio (ref 0.0–5.0)
Cholesterol, Total: 135 mg/dL (ref 100–199)
HDL: 64 mg/dL (ref >39)
LDL Chol Calc (NIH): 57 mg/dL (ref 0–99)
Triglycerides: 70 mg/dL (ref 0–149)
VLDL Cholesterol Cal: 14 mg/dL (ref 5–40)

## 2024-01-04 LAB — HEMOGLOBIN A1C
Est. average glucose Bld gHb Est-mCnc: 128 mg/dL
Hgb A1c MFr Bld: 6.1 % — ABNORMAL HIGH (ref 4.8–5.6)

## 2024-05-08 ENCOUNTER — Ambulatory Visit: Payer: Self-pay | Admitting: Nurse Practitioner

## 2024-05-08 NOTE — Progress Notes (Deleted)
 LILLETTE Kristeen JINNY Gladis, CMA,acting as a neurosurgeon for [name], FNP.,have documented all relevant documentation on the behalf of [name], FNP,as directed by  [name], FNP while in the presence of [name], FNP.  Subjective:  Patient ID: Johnathan English , male    DOB: [birth year] , 33 y.o.   MRN: [id number]  No chief complaint on file.   HPI  HPI   No past medical history on file.   Family History  Problem Relation Age of Onset   Hypertension Mother    Healthy Father    Healthy Sister    Healthy Brother      Current Outpatient Medications:    meloxicam  (MOBIC ) 15 MG tablet, Take 1 tablet by mouth daily x 5 days then daily as needed, Disp: 30 tablet, Rfl: 2   No Known Allergies   Review of Systems   There were no vitals filed for this visit. There is no height or weight on file to calculate BMI.  Wt Readings from Last 3 Encounters:  01/03/24 259 lb (117.5 kg)  07/06/23 276 lb 6.4 oz (125.4 kg)  06/30/22 283 lb (128.4 kg)    The ASCVD Risk score (Arnett DK, et al., 2019) failed to calculate for the following reasons:   The 2019 ASCVD risk score is only valid for ages 36 to 80  Objective:  Physical Exam      Assessment And Plan:   Assessment & Plan Prediabetes  High triglycerides   No orders of the defined types were placed in this encounter.    No follow-ups on file.  Patient was given opportunity to ask questions. Patient verbalized understanding of the plan and was able to repeat key elements of the plan. All questions were answered to their satisfaction.    LILLETTE [name], FNP, have reviewed all documentation for this visit. The documentation on 05/08/24 for the exam, diagnosis, procedures, and orders are all accurate and complete.   IF YOU HAVE BEEN REFERRED TO A SPECIALIST, IT MAY TAKE 1-2 WEEKS TO SCHEDULE/PROCESS THE REFERRAL. IF YOU HAVE NOT HEARD FROM US /SPECIALIST IN TWO WEEKS, PLEASE GIVE US  A CALL AT [phone].

## 2024-07-09 ENCOUNTER — Encounter: Payer: BC Managed Care – PPO | Admitting: Nurse Practitioner
